# Patient Record
Sex: Female | Born: 1990 | Race: White | Hispanic: No | Marital: Married | Smoking: Never smoker
Health system: Southern US, Community
[De-identification: ages and names within clinical notes are randomized; demographics above are authoritative.]

## PROBLEM LIST (undated history)

## (undated) DIAGNOSIS — F909 Attention-deficit hyperactivity disorder, unspecified type: Secondary | ICD-10-CM

## (undated) HISTORY — DX: Attention-deficit hyperactivity disorder, unspecified type: F90.9

## (undated) HISTORY — PX: SHOULDER SURGERY: SHX246

---
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
## 2010-06-29 ENCOUNTER — Encounter: Admission: RE | Admit: 2010-06-29 | Discharge: 2010-08-05 | Payer: Self-pay | Admitting: Orthopedic Surgery

## 2011-03-22 ENCOUNTER — Other Ambulatory Visit: Payer: Self-pay | Admitting: Orthopedic Surgery

## 2011-03-22 DIAGNOSIS — M25511 Pain in right shoulder: Secondary | ICD-10-CM

## 2011-03-22 DIAGNOSIS — R531 Weakness: Secondary | ICD-10-CM

## 2011-04-11 ENCOUNTER — Ambulatory Visit
Admission: RE | Admit: 2011-04-11 | Discharge: 2011-04-11 | Disposition: A | Discharge status: Home / Self Care | Payer: 59 | Source: Ambulatory Visit | Attending: Orthopedic Surgery | Admitting: Orthopedic Surgery

## 2011-04-11 DIAGNOSIS — M25511 Pain in right shoulder: Secondary | ICD-10-CM

## 2011-04-11 DIAGNOSIS — R531 Weakness: Secondary | ICD-10-CM

## 2011-05-17 ENCOUNTER — Ambulatory Visit: Payer: PPO | Attending: Orthopedic Surgery | Admitting: Physical Therapy

## 2011-05-17 DIAGNOSIS — R269 Unspecified abnormalities of gait and mobility: Secondary | ICD-10-CM | POA: Insufficient documentation

## 2011-05-17 DIAGNOSIS — IMO0001 Reserved for inherently not codable concepts without codable children: Secondary | ICD-10-CM | POA: Insufficient documentation

## 2011-05-17 DIAGNOSIS — M25569 Pain in unspecified knee: Secondary | ICD-10-CM | POA: Insufficient documentation

## 2011-05-24 ENCOUNTER — Ambulatory Visit: Payer: PPO | Attending: Orthopedic Surgery | Admitting: Rehabilitation

## 2011-05-24 DIAGNOSIS — M25569 Pain in unspecified knee: Secondary | ICD-10-CM | POA: Insufficient documentation

## 2011-05-24 DIAGNOSIS — IMO0001 Reserved for inherently not codable concepts without codable children: Secondary | ICD-10-CM | POA: Insufficient documentation

## 2011-05-24 DIAGNOSIS — R269 Unspecified abnormalities of gait and mobility: Secondary | ICD-10-CM | POA: Insufficient documentation

## 2012-02-16 ENCOUNTER — Ambulatory Visit (INDEPENDENT_AMBULATORY_CARE_PROVIDER_SITE_OTHER): Payer: Self-pay | Admitting: Family Medicine

## 2012-02-16 ENCOUNTER — Encounter: Payer: Self-pay | Admitting: Family Medicine

## 2012-02-16 VITALS — BP 123/87 | HR 71 | Temp 98.0°F | Ht 64.0 in | Wt 150.0 lb

## 2012-02-16 DIAGNOSIS — M25511 Pain in right shoulder: Secondary | ICD-10-CM

## 2012-02-16 DIAGNOSIS — M25519 Pain in unspecified shoulder: Secondary | ICD-10-CM

## 2012-02-16 NOTE — Patient Instructions (Addendum)
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 I doubt you reinjured the labrum in your right shoulder - when the [facility] coverage goes through, call me and we can reorder an MR arthrogram to assess if you would like to go ahead with this. 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 Ice your shoulder 15 minutes at a time 3-4 times a day as needed (before and after games/practices). When possible, try to limit overhead and reaching activities. Can try aspercreme topically, aleve 2 tabs twice a day with food for inflammation. Nitro patches are a consideration - use 1/4th of a patch over affected area and change daily. If you get a headache from the patches and it's mild, ok to take tylenol, aleve for pain.  If it's too severe, stop the patch. A cortisone shot is a consideration but I wouldn't recommend doing this more than once and you'd have to be out for a week following the injection. Follow up with me in 6 weeks if I don't hear from you before then.

## 2012-02-19 ENCOUNTER — Encounter: Payer: Self-pay | Admitting: Family Medicine

## 2012-02-19 DIAGNOSIS — M25511 Pain in right shoulder: Secondary | ICD-10-CM | POA: Insufficient documentation

## 2012-02-19 NOTE — Assessment & Plan Note (Signed)
Her exam and ultrasound are consistent with impingement syndrome and biceps tendinopathy.  She has not had an injury I would suspect to cause a new labral tear (these are not well seen on ultrasound) but she is compliant with a home exercise program that would address her two diagnoses.  She does not have insurance - applying through the [facility] program.  Advised if this goes through, would consider repeating her MR arthrogram.  As far as treatment options we discussed formal physical therapy, nitro patches as adjunctive treatment (good success for rotator cuff syndrome not responding to usual therapies), subacromial injection (would need to be out of softball for at least a week following this).  She will consider trying nitro patches (sent in to pharmacy) - declined PT and injection for now.  Once [facility] coverage goes through advised her to call me if she would like to proceed with MR arthrogram.  See instructions for further.

## 2012-02-19 NOTE — Progress Notes (Signed)
Subjective:    Patient ID: Carrie Hamilton, female    DOB: 13-May-1991, 21 y.o.   MRN: [id number]  PCP: Laurann Montana  HPI 21 yo F here for right shoulder pain.  Patient reports no prior acute injury to shoulder - had insidious onset of pain in high school within right shoulder with throwing. With rest this would resolve but would worsen when she returned to sports. Had eval by Dr. August Saucer, eventually had MR arthrogram and surgery to repair torn labrum in right shoulder. She never completely improved from this procedure as she thought she would if this was the cause of her pain. No prior history of dislocation, trauma. Doing rehab with trainers regularly and is compliant with this. She is right handed. Pain now is anterior, deep but also goes into mid-bicep. States she did mildly reinjure this about 2-3 weeks ago - was lifting and going across body when felt a pop in shoulder (some ant and posterior).  No bruising, swelling following this. + night pain. Taking nsaids, alternating ice and heat. Last MR arthrogram was in 03/2011 showed an intact labrum, tiny cystic degenerative change in humerus that could predispose to impingement of infraspinatus, teres minor.  History reviewed. No pertinent past medical history.  No current outpatient prescriptions on file prior to visit.    Past Surgical History  Procedure Date  . Shoulder surgery     labral repair of right shoulder    Allergies  Allergen Reactions  . Sulfa Antibiotics     History   Social History  . Marital Status: Married    Spouse Name: N/A    Number of Children: N/A  . Years of Education: N/A   Occupational History  . Not on file.   Social History Main Topics  . Smoking status: Never Smoker   . Smokeless tobacco: Not on file  . Alcohol Use: Not on file  . Drug Use: Not on file  . Sexually Active: Not on file   Other Topics Concern  . Not on file   Social History Narrative  . No narrative on file     Family History  Problem Relation Age of Onset  . Sudden death Neg Hx   . Hyperlipidemia Neg Hx   . Heart attack Neg Hx   . Diabetes Neg Hx   . Hypertension Neg Hx     BP 123/87  Pulse 71  Temp(Src) 98 F (36.7 C) (Oral)  Ht 5\' 4"  (1.626 m)  Wt 150 lb (68.04 kg)  BMI 25.75 kg/m2  Review of Systems See HPI above.    Objective:   Physical Exam Gen: NAD  R shoulder: No swelling, ecchymoses.  No gross deformity. TTP biceps tendon, into proximal biceps muscle.  Minimal AC TTP. FROM with painful arc. Positive Hawkins, Neers. Positive yergasons, negative speeds. Strength 5/5 with empty can and resisted internal/external rotation.  Pain with empty can. Negative apprehension. Mildly positive o'briens. NV intact distally.  L shoulder: FROM without pain, weakness.  MSK u/s R shoulder: biceps tendon is intact on trans and long views - no neovacularity.  AC appears normal.  Infraspinatus, subscapularis, supraspinatus tendons intact on trans and long views.  No impingement of subscap on coracoid.  Demonstrated impingement of supraspinatus under acromion.    Assessment & Plan:  1. Right shoulder pain - Her exam and ultrasound are consistent with impingement syndrome and biceps tendinopathy.  She has not had an injury I would suspect to cause a new labral tear (these  are not well seen on ultrasound) but she is compliant with a home exercise program that would address her two diagnoses.  She does not have insurance - applying through the [facility] program.  Advised if this goes through, would consider repeating her MR arthrogram.  As far as treatment options we discussed formal physical therapy, nitro patches as adjunctive treatment (good success for rotator cuff syndrome not responding to usual therapies), subacromial injection (would need to be out of softball for at least a week following this).  She will consider trying nitro patches (sent in to pharmacy) - declined PT and injection for  now.  Once [facility] coverage goes through advised her to call me if she would like to proceed with MR arthrogram.  See instructions for further.

## 2012-02-21 ENCOUNTER — Ambulatory Visit: Payer: PPO | Admitting: Family Medicine

## 2012-02-23 ENCOUNTER — Encounter: Payer: Self-pay | Admitting: Family Medicine

## 2012-04-24 ENCOUNTER — Ambulatory Visit: Payer: PPO | Admitting: Family Medicine

## 2012-04-24 ENCOUNTER — Encounter: Payer: Self-pay | Admitting: Family Medicine

## 2012-04-24 ENCOUNTER — Ambulatory Visit (INDEPENDENT_AMBULATORY_CARE_PROVIDER_SITE_OTHER): Payer: 59 | Admitting: Family Medicine

## 2012-04-24 ENCOUNTER — Ambulatory Visit (INDEPENDENT_AMBULATORY_CARE_PROVIDER_SITE_OTHER)
Admission: RE | Admit: 2012-04-24 | Discharge: 2012-04-24 | Disposition: A | Discharge status: Home / Self Care | Payer: 59 | Source: Ambulatory Visit | Attending: Family Medicine | Admitting: Family Medicine

## 2012-04-24 VITALS — BP 110/80 | HR 60 | Temp 98.0°F | Ht 64.0 in | Wt 148.2 lb

## 2012-04-24 DIAGNOSIS — M25539 Pain in unspecified wrist: Secondary | ICD-10-CM

## 2012-04-24 DIAGNOSIS — M25532 Pain in left wrist: Secondary | ICD-10-CM

## 2012-04-24 NOTE — Patient Instructions (Signed)
Xray today  Use ice on wrist and hand 10 minutes at a time whenever you can  Will update you tomorrow

## 2012-04-24 NOTE — Assessment & Plan Note (Signed)
Wrist and hand pain after injury - over extension sliding into base Initial severe pain and swelling has improved  Out of her brace Radial wrist pain on exam worse with flexion nsaid was not helping previously Xray today and update
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
## 2012-10-01 ENCOUNTER — Other Ambulatory Visit (HOSPITAL_COMMUNITY)
Admission: RE | Admit: 2012-10-01 | Discharge: 2012-10-01 | Disposition: A | Discharge status: Home / Self Care | Payer: 59 | Source: Ambulatory Visit | Attending: Family Medicine | Admitting: Family Medicine

## 2012-10-01 ENCOUNTER — Encounter: Payer: Self-pay | Admitting: Family Medicine

## 2012-10-01 ENCOUNTER — Ambulatory Visit (INDEPENDENT_AMBULATORY_CARE_PROVIDER_SITE_OTHER): Payer: 59 | Admitting: Family Medicine

## 2012-10-01 VITALS — BP 114/78 | HR 80 | Temp 98.3°F | Ht 64.0 in | Wt 150.2 lb

## 2012-10-01 DIAGNOSIS — L739 Follicular disorder, unspecified: Secondary | ICD-10-CM | POA: Insufficient documentation

## 2012-10-01 DIAGNOSIS — Z01419 Encounter for gynecological examination (general) (routine) without abnormal findings: Secondary | ICD-10-CM | POA: Insufficient documentation

## 2012-10-01 DIAGNOSIS — L738 Other specified follicular disorders: Secondary | ICD-10-CM

## 2012-10-01 MED ORDER — NORGESTIM-ETH ESTRAD TRIPHASIC 0.18/0.215/0.25 MG-35 MCG PO TABS: 1.0000 | ORAL_TABLET | Freq: Every day | ORAL | Status: DC

## 2012-10-01 MED ORDER — CLINDAMYCIN HCL 300 MG PO CAPS: 300.0000 mg | ORAL_CAPSULE | Freq: Three times a day (TID) | ORAL | Status: DC

## 2012-10-01 NOTE — Progress Notes (Signed)
Subjective:    Patient ID: Carrie Hamilton, female    DOB: 1991-05-17, 21 y.o.   MRN: [id number]  HPI Here for OC refil and also for a bumps on her hip  Has had a staph infection (not MRSA) about a year ago -not entirely sure Was under arms  Now has red bumps - some of them look like pustules  Started about a month ago  Started echinasea- did not help  No topical meds except tea tree oil  It does not itch , is painful   On tri sprintek  Periods are ok -- they last 4-5 days - not too heavy or too painful Is sexually active  Is using condoms   Has not had a pap smear before  Is not on period  She has had the hpv vaccine  Is not worried about std - and does not want to be tested  Is monogamous and has been checked before   Patient Active Problem List  Diagnosis  . Right shoulder pain  . Left wrist pain   No past medical history on file. Past Surgical History  Procedure Date  . Shoulder surgery     labral repair of right shoulder   History  Substance Use Topics  . Smoking status: Never Smoker   . Smokeless tobacco: Not on file  . Alcohol Use: Yes     occasional   Family History  Problem Relation Age of Onset  . Sudden death Neg Hx   . Hyperlipidemia Neg Hx   . Heart attack Neg Hx   . Diabetes Neg Hx   . Hypertension Neg Hx    Allergies  Allergen Reactions  . Sulfa Antibiotics    Current Outpatient Prescriptions on File Prior to Visit  Medication Sig Dispense Refill  . Norgestimate-Ethinyl Estradiol Triphasic (TRI-SPRINTEC) 0.18/0.215/0.25 MG-35 MCG tablet Take 1 tablet by mouth daily.             Review of Systems Review of Systems  Constitutional: Negative for fever, appetite change, fatigue and unexpected weight change.  Eyes: Negative for pain and visual disturbance.  Respiratory: Negative for cough and shortness of breath.   Cardiovascular: Negative for cp or palpitations    Gastrointestinal: Negative for nausea, diarrhea and constipation.    Genitourinary: Negative for urgency and frequency.  Skin: Negative for pallor and pos for rash Neurological: Negative for weakness, light-headedness, numbness and headaches.  Hematological: Negative for adenopathy. Does not bruise/bleed easily.  Psychiatric/Behavioral: Negative for dysphoric mood. The patient is not nervous/anxious.         Objective:   Physical Exam  Constitutional: She appears well-developed and well-nourished. No distress.  HENT:  Head: Normocephalic and atraumatic.  Mouth/Throat: Oropharynx is clear and moist. No oropharyngeal exudate.  Eyes: Conjunctivae normal and EOM are normal. Pupils are equal, round, and reactive to light. Right eye exhibits no discharge. Left eye exhibits no discharge.  Neck: Normal range of motion. Neck supple. No JVD present. Carotid bruit is not present. No thyromegaly present.  Cardiovascular: Normal rate, regular rhythm and normal heart sounds.   Pulmonary/Chest: Effort normal and breath sounds normal. No respiratory distress. She has no wheezes.  Abdominal: Soft. Bowel sounds are normal. She exhibits no distension, no abdominal bruit and no mass. There is no tenderness.  Genitourinary: Vagina normal. No breast swelling, tenderness, discharge or bleeding. There is no rash, tenderness or lesion on the right labia. There is no rash, tenderness or lesion on the left labia. Uterus is  not enlarged and not tender. Cervix exhibits no motion tenderness, no discharge and no friability. Right adnexum displays no mass, no tenderness and no fullness. Left adnexum displays no mass, no tenderness and no fullness.       Breast exam: No mass, nodules, thickening, tenderness, bulging, retraction, inflamation, nipple discharge or skin changes noted.  No axillary or clavicular LA.  Chaperoned exam.    Musculoskeletal: She exhibits no edema.  Lymphadenopathy:    She has no cervical adenopathy.  Neurological: She is alert. She has normal reflexes. Coordination  normal.  Skin: Skin is warm and dry. Rash noted.       Folliculitis with some papules and pustules on L outer thigh No vesicles    Psychiatric: She has a normal mood and affect.          Assessment & Plan:

## 2012-10-01 NOTE — Patient Instructions (Addendum)
Clean rash area with antibacterial soap and water Don't shave until better  Take clindamycin as directed Update if not starting to improve in a week or if worsening   We did pap smear today

## 2012-10-01 NOTE — Assessment & Plan Note (Signed)
Did first pap today  Pt tolerated well Refilled OC No problems Disc std prev

## 2012-10-01 NOTE — Assessment & Plan Note (Signed)
On L upper outer thigh ? Hx of mrsa in past This is mild See avs for inst tx with clindamycin and update

## 2012-10-03 ENCOUNTER — Encounter: Payer: Self-pay | Admitting: *Deleted

## 2012-10-07 ENCOUNTER — Ambulatory Visit: Payer: 59 | Admitting: Family Medicine

## 2012-10-14 ENCOUNTER — Telehealth: Payer: Self-pay

## 2012-10-14 MED ORDER — CLINDAMYCIN HCL 300 MG PO CAPS: 300.0000 mg | ORAL_CAPSULE | Freq: Three times a day (TID) | ORAL | Status: DC

## 2012-10-14 NOTE — Telephone Encounter (Signed)
Pt notified Rx was sent in and if bump comes back needs to follow up with doctor either at college or here

## 2012-10-14 NOTE — Telephone Encounter (Signed)
Pt left v/m requesting antibiotic; bump on hip has come back; unable to reach pt but spoke with pts mother and pt is at college request med sent to [facility];  bump completely went away but pt noticed bump again on 10/13/12.Please advise.

## 2012-10-14 NOTE — Telephone Encounter (Signed)
Will tx with cleocin again but if it does not improve she will need to see a doctor at her college  Sent electronically

## 2012-10-24 ENCOUNTER — Telehealth: Payer: Self-pay | Admitting: Family Medicine

## 2012-10-24 MED ORDER — FLUCONAZOLE 150 MG PO TABS: ORAL_TABLET | ORAL | Status: DC

## 2012-10-24 NOTE — Telephone Encounter (Signed)
LMOM that rx was called in and to call if any questions or problems

## 2012-10-24 NOTE — Telephone Encounter (Signed)
Okay to send Rx for fluconazole 150mg  #2 x 0 Take 1 now for yeast infection. Repeat in 1 week prn

## 2012-10-24 NOTE — Telephone Encounter (Signed)
Caller: Wanda/Mother; Patient Name: Carrie Hamilton; PCP: [facility] Best Callback Phone Number: [phone] Pt has been taking antbiotics for a staph infection dx by Dr. Milinda Antis. Pt has had to take 2 rounds. Pt has developed a vaginal yeast infection and wants to have a yeast tx called in to [facility]. Mom did not ask any questions/but its the same sx as last time. Pt unavailable for triage./in classes all day. Office please call in yeast medication to [facility].

## 2013-02-19 ENCOUNTER — Other Ambulatory Visit: Payer: Self-pay | Admitting: Family Medicine

## 2013-02-19 MED ORDER — NORGESTIM-ETH ESTRAD TRIPHASIC 0.18/0.215/0.25 MG-35 MCG PO TABS: 1.0000 | ORAL_TABLET | Freq: Every day | ORAL | Status: DC

## 2013-02-19 NOTE — Telephone Encounter (Signed)
Received request for 3 month supply instead of 1 month supply, Rx changed

## 2013-06-08 IMAGING — CR DG WRIST COMPLETE 3+V*L*
2 series · 2 of 2 positions shown · non-contrast
Comparison: None.

CLINICAL DATA: Left wrist pain post injury

LEFT WRIST - COMPLETE 3+ VIEW

[view not recorded (1 of 2)]
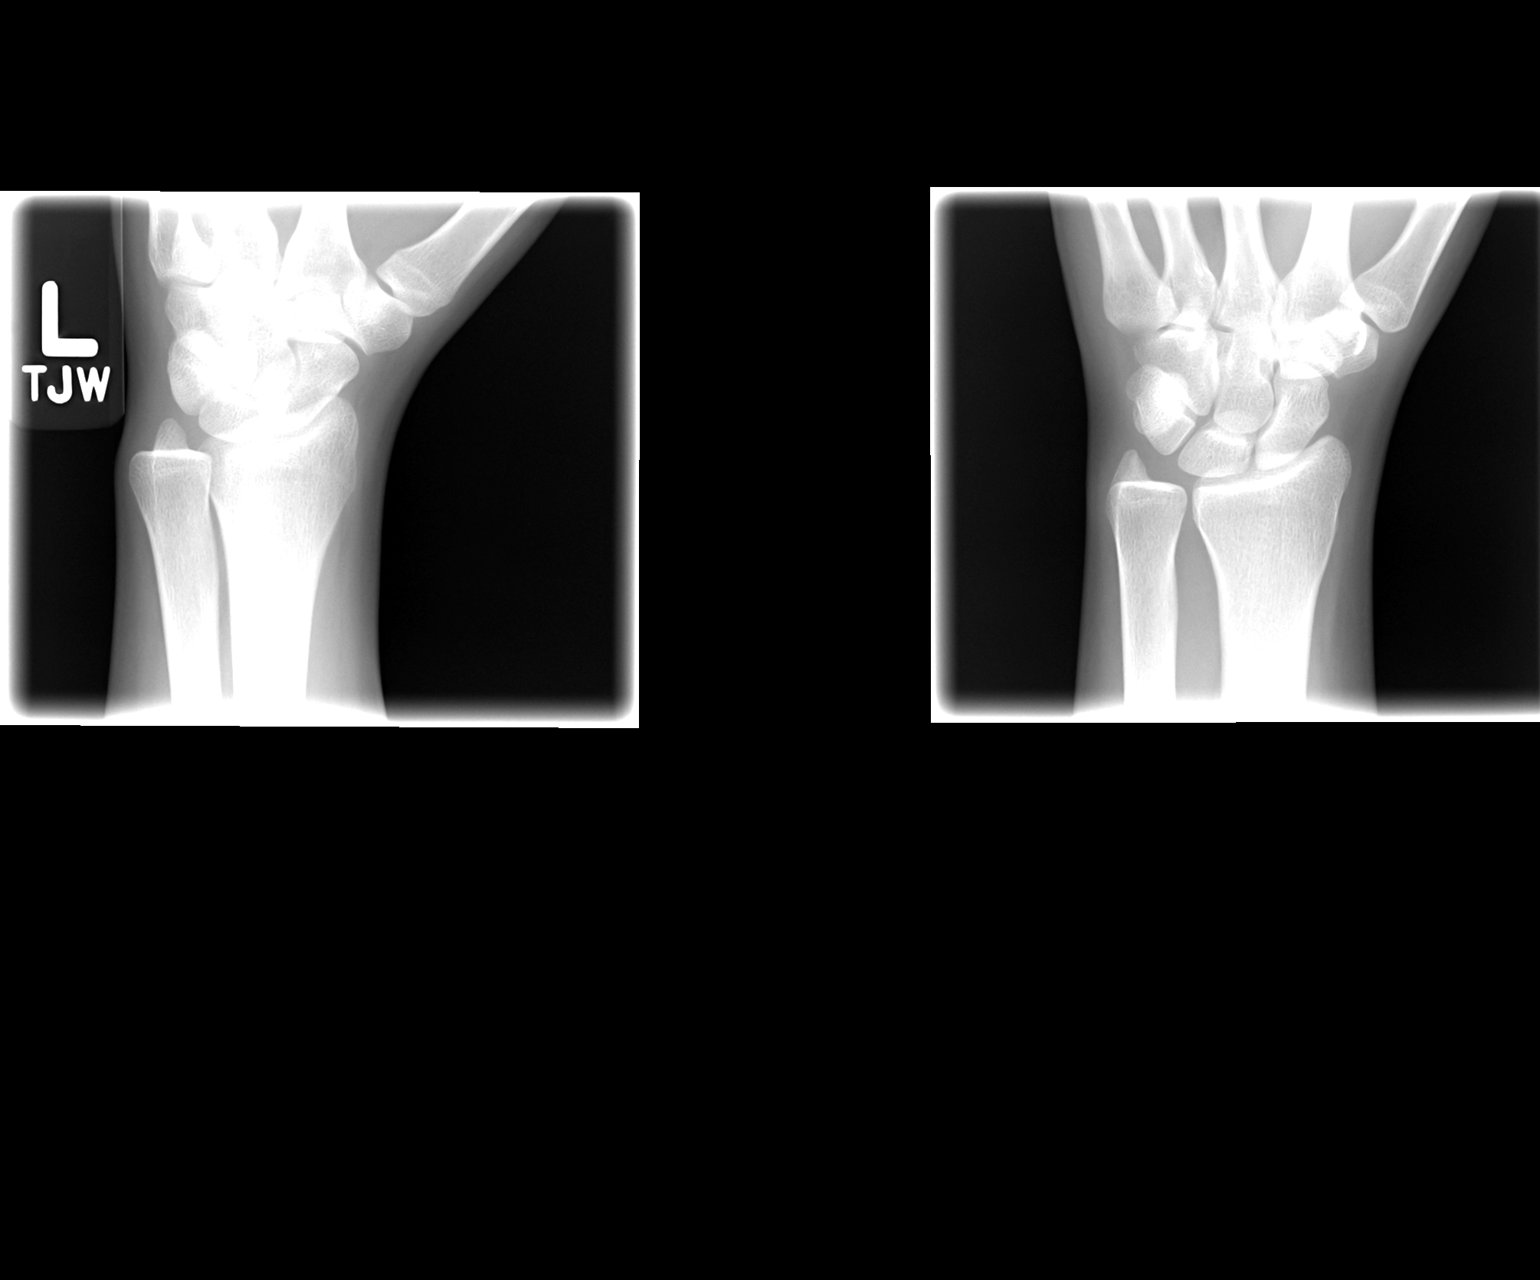

[view not recorded (2 of 2)]
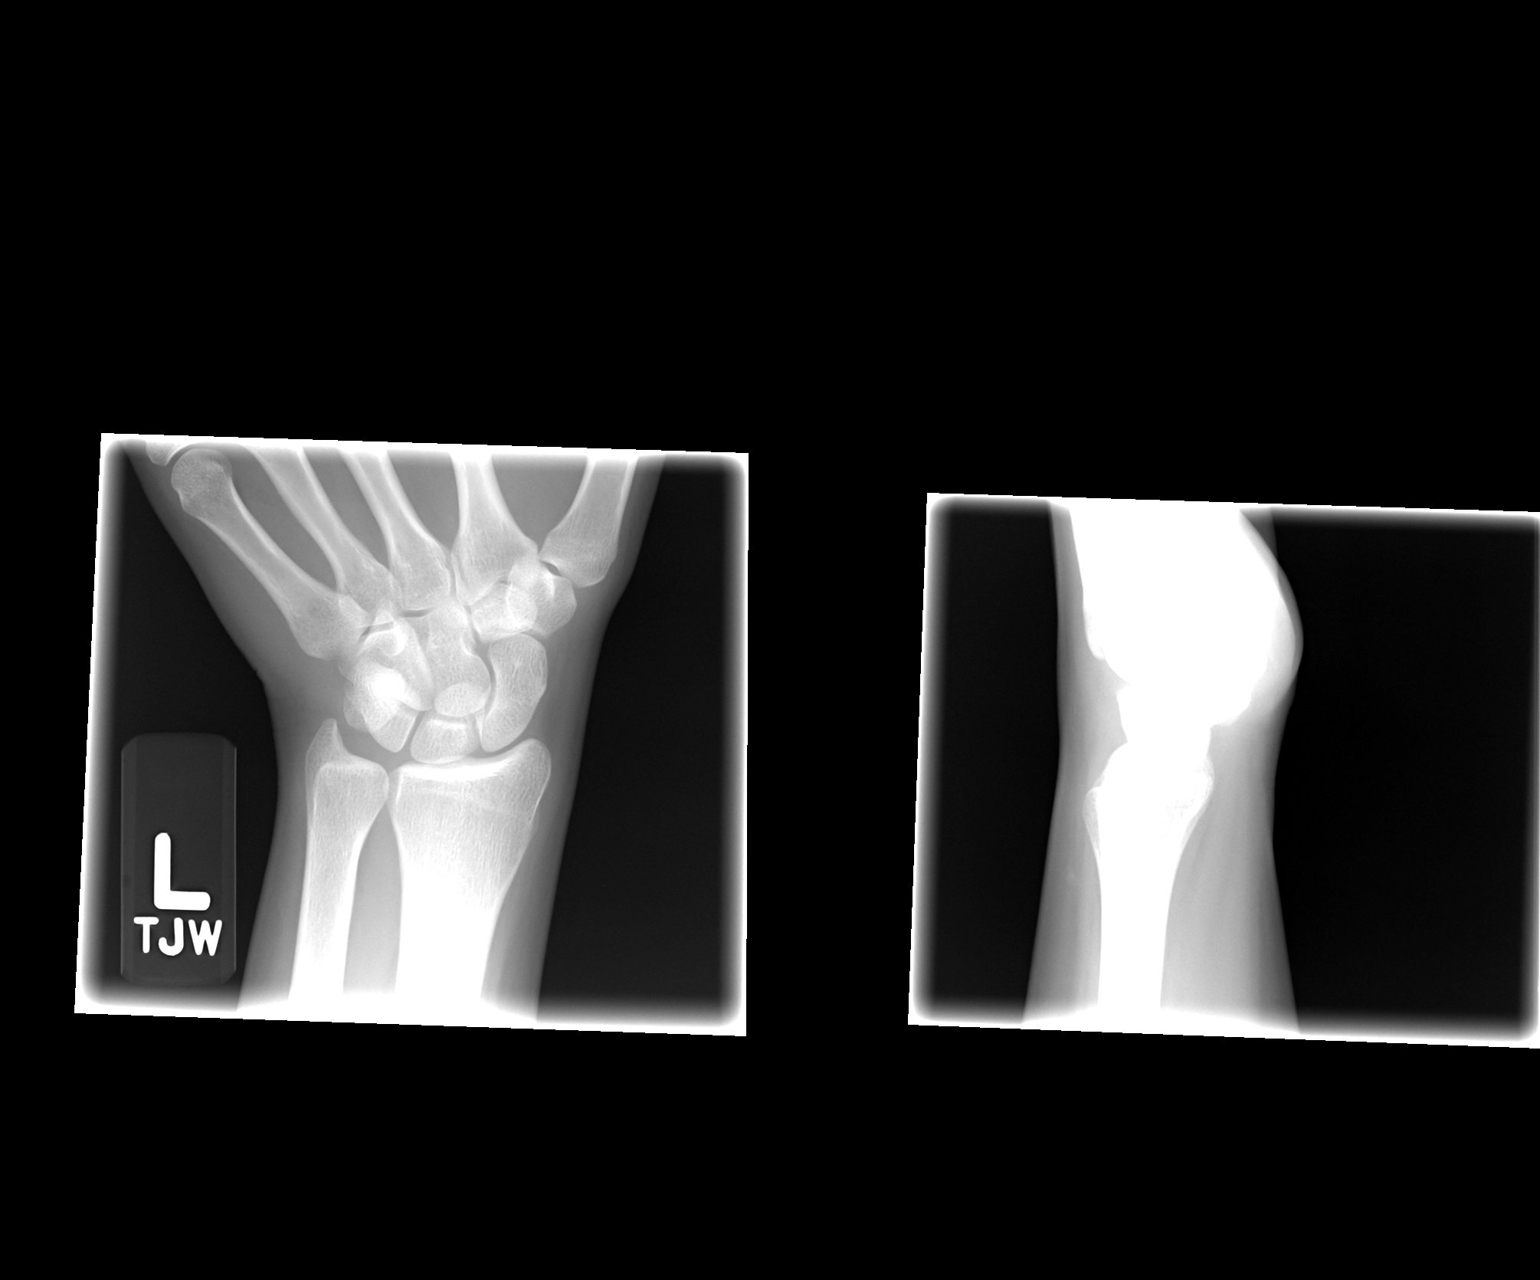

[2 of 2 positions shown; findings below may reference images not displayed]

FINDINGS: Four views of the left wrist submitted.  No acute
fracture or subluxation.  No radiopaque foreign body.
IMPRESSION: No acute fracture or subluxation.

## 2013-08-26 ENCOUNTER — Telehealth: Payer: Self-pay

## 2013-08-26 NOTE — Telephone Encounter (Signed)
Stop it and stay off of it  Follow up with me when able to see how bp is off of med and discuss options  If sob returns or cp-go to ER If she is in school and unable to come in here - I would go to the health clinic at her school  Please remove from her med list

## 2013-08-26 NOTE — Telephone Encounter (Signed)
Pt taking tri trevifen thinks causing BP to be elevated; BP averages 140/90 for the last month. No h/a,dizziness or SOB. On 08/25/13 felt like heart racing and hard to get breath. Today pt did not take tri trevifen and pt feels OK. [facility] [address]Please advise.

## 2013-08-26 NOTE — Telephone Encounter (Signed)
Pt advise to stop med, med removed from med list. Pt advise if sob or cp go to ER. Pt is able to schedule a f/u with you but wasn't sure what time would work for her so she will call back to schedule a f/u appt

## 2019-04-11 DIAGNOSIS — R030 Elevated blood-pressure reading, without diagnosis of hypertension: Secondary | ICD-10-CM | POA: Insufficient documentation

## 2020-11-09 ENCOUNTER — Encounter: Payer: Self-pay | Admitting: *Deleted

## 2020-11-16 ENCOUNTER — Ambulatory Visit (INDEPENDENT_AMBULATORY_CARE_PROVIDER_SITE_OTHER): Payer: Commercial Managed Care - PPO

## 2020-11-16 ENCOUNTER — Other Ambulatory Visit: Payer: Self-pay

## 2020-11-16 VITALS — BP 137/96 | HR 108 | Wt 191.0 lb

## 2020-11-16 DIAGNOSIS — Z6791 Unspecified blood type, Rh negative: Secondary | ICD-10-CM | POA: Insufficient documentation

## 2020-11-16 DIAGNOSIS — O26899 Other specified pregnancy related conditions, unspecified trimester: Secondary | ICD-10-CM | POA: Insufficient documentation

## 2020-11-16 DIAGNOSIS — Z3A16 16 weeks gestation of pregnancy: Secondary | ICD-10-CM

## 2020-11-16 DIAGNOSIS — R030 Elevated blood-pressure reading, without diagnosis of hypertension: Secondary | ICD-10-CM

## 2020-11-16 DIAGNOSIS — Z3480 Encounter for supervision of other normal pregnancy, unspecified trimester: Secondary | ICD-10-CM | POA: Insufficient documentation

## 2020-11-16 NOTE — Progress Notes (Signed)
Subjective:   Carrie Hamilton is a 29 y.o. G2P0010 at [redacted]w[redacted]d by [employer] being seen today for her first obstetrical visit after transferring from [facility].  Patient reports change due to insurance and expresses desire for WB.  Gynecological/Obstetrical History: Her obstetrical history is unremarkable. Patient does intend to breast feed. Pregnancy history fully reviewed. Patient reports no complaints. Sexual Activity and Vaginal Concerns: Patient denies issues with vaginal discharge or bleeding.   Medical History/ROS:Denies significant medical history.  Reports feeling "winded easier" with some activities such as walking and home DIY projects.  Patient denies issues with urination, constipation, and diarrhea.    Social History: Patient denies history or current usage of tobacco, alcohol, or drugs.  Patient reports the FOB is Joselyn Glassman who is present and supportive.  Patient reports that she lives with husband and endorses safety at home. 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  HISTORY: OB History  Gravida Para Term Preterm AB Living  2 0 0 0 1 0  SAB TAB Ectopic Multiple Live Births  1 0 0 0 0    # Outcome Date GA Lbr Len/2nd Weight Sex Delivery Anes PTL Lv  2 Current           1 SAB             Last pap smear was done Oct 2020 and was normal  Past Medical History:  Diagnosis Date  . ADHD    Past Surgical History:  Procedure Laterality Date  . SHOULDER SURGERY     labral repair of right shoulder   Family History  Problem Relation Age of Onset  . Hypertension Mother   . Heart attack Father   . Sudden death Neg Hx   . Hyperlipidemia Neg Hx   . Diabetes Neg Hx    Social History   Tobacco Use  . Smoking status: Never Smoker  Vaping Use  . Vaping Use: Never used  Substance Use Topics  . Alcohol use: Yes    Comment: occasional  . Drug use: No   Allergies  Allergen Reactions  . Sulfa Antibiotics Rash   Current Outpatient Medications on  File Prior to Visit  Medication Sig Dispense Refill  . Ascorbic Acid (VITAMIN C) 1000 MG tablet Take 1,000 mg by mouth daily.    . Magnesium 250 MG TABS Take by mouth.    . Omega-3 Fatty Acids (FISH OIL) 500 MG CAPS Take by mouth.    . Prenatal Vit-Fe Fumarate-FA (PRENATAL VITAMINS PO) Take by mouth.    . vitamin B-12 (CYANOCOBALAMIN) 100 MCG tablet Take 1 tablet by mouth daily.    Marland Kitchen VITAMIN D-VITAMIN K PO Take by mouth.    . Zinc Sulfate (ZINC 15 PO) Take by mouth.     No current facility-administered medications on file prior to visit.    Review of Systems Pertinent items noted in HPI and remainder of comprehensive ROS otherwise negative.  Exam   Vitals:   11/16/20 1022  BP: (!) 137/96  Pulse: (!) 108  Weight: 191 lb (86.6 kg)   Fetal Heart Rate (bpm): 154  Physical Exam Constitutional:      General: She is not in acute distress.    Appearance: Normal appearance. She is not toxic-appearing.  HENT:     Head: Normocephalic and atraumatic.  Eyes:     Conjunctiva/sclera: Conjunctivae normal.  Cardiovascular:     Rate and Rhythm: Normal rate and regular rhythm.  Heart sounds: Normal heart sounds.  Pulmonary:     Effort: Pulmonary effort is normal. No respiratory distress.     Breath sounds: Normal breath sounds.  Abdominal:     General: Bowel sounds are normal.     Comments: Fundus at U/-2  Musculoskeletal:        General: Normal range of motion.     Cervical back: Normal range of motion.     Right lower leg: No edema.     Left lower leg: No edema.  Neurological:     Mental Status: She is alert and oriented to person, place, and time.  Skin:    General: Skin is warm and dry.  Psychiatric:        Mood and Affect: Mood normal.        Behavior: Behavior normal.        Thought Content: Thought content normal.     Assessment:   29 y.o. year old G2P0010 Patient Active Problem List   Diagnosis Date Noted  . Supervision of other normal pregnancy, antepartum  11/16/2020  . Rh negative status during pregnancy 11/16/2020  . Routine gynecological examination 10/01/2012  . Folliculitis 10/01/2012  . Left wrist pain 04/24/2012  . Right shoulder pain 02/19/2012     Plan:  1. Supervision of other normal pregnancy, antepartum -Congratulations given and patient welcomed to practice. -Discussed usage of Babyscripts and virtual visits as additional source of managing and completing PN visits in midst of coronavirus.   *Instructed to take blood pressure and record weekly into babyscripts. *Reviewed modified prenatal visit schedule and platforms used for virtual visits.  -Anticipatory guidance for prenatal visits including labs, ultrasounds, and testing. -Genetic Screening discussed, First trimester screen, Quad screen and NIPS: declined. -Encouraged to complete MyChart Registration for her ability to review results, send requests, and have questions addressed.  -Discussed estimated due date of May 03, 2021. -Ultrasound discussed; fetal anatomic survey: ordered. -Initiate prenatal vitamins; Rx sent to pharmacy on file.  -Influenza offered and declined -Covid Vaccine offered and declined. -Encouraged to seek out care at office or emergency room for urgent and/or emergent concerns. -Educated on the nature of [facility] with multiple MDs and other Advanced Practice Providers was explained to patient; also emphasized that residents, students are part of our team. Informed of her right to refuse care as she deems appropriate.  -No questions or concerns.    2. Rh negative, antepartum -Informed of RH status. -Educated on what this means for patient, baby, and future pregnancies. -Reviewed need for rhogam at 30 weeks or before with significant bleeding incidents.   3. [redacted] weeks gestation of pregnancy -Anticipatory guidance for upcoming appts. -Reviewed desire and candidacy for WB. -Discussed and shown [email or web address]  site.  Encouraged to sign up for birthing classes as well as complete virtual tour of hospital. -Discussed activities during pregnancy.  Reassured that some feelings of "winded" normal.  Encouraged rest, hydration, and increase in protein rich snacks. -Encouraged to contact insurance for resources available (I.e. breast pumps, nursing lines, child birthing books).  4. Elevated BP without diagnosis of hypertension -Informed of potential for CHTN diagnosis of bp remain elevated.  -Encouraged to record BP into mychart   Problem list reviewed and updated. Routine obstetric precautions reviewed.  No orders of the defined types were placed in this encounter.   No follow-ups on file.     Cherre Robins, CNM 11/16/2020 11:03 AM

## 2020-11-16 NOTE — Patient Instructions (Signed)

## 2020-11-16 NOTE — Progress Notes (Signed)
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

## 2020-12-07 ENCOUNTER — Ambulatory Visit: Payer: Commercial Managed Care - PPO

## 2020-12-07 ENCOUNTER — Other Ambulatory Visit: Payer: Self-pay | Admitting: *Deleted

## 2020-12-07 ENCOUNTER — Other Ambulatory Visit: Payer: Self-pay

## 2020-12-07 DIAGNOSIS — Z3A16 16 weeks gestation of pregnancy: Secondary | ICD-10-CM

## 2020-12-07 DIAGNOSIS — Z362 Encounter for other antenatal screening follow-up: Secondary | ICD-10-CM

## 2020-12-07 DIAGNOSIS — Z3480 Encounter for supervision of other normal pregnancy, unspecified trimester: Secondary | ICD-10-CM | POA: Diagnosis present

## 2020-12-15 ENCOUNTER — Ambulatory Visit (INDEPENDENT_AMBULATORY_CARE_PROVIDER_SITE_OTHER): Payer: Commercial Managed Care - PPO | Admitting: Advanced Practice Midwife

## 2020-12-15 ENCOUNTER — Other Ambulatory Visit: Payer: Self-pay

## 2020-12-15 VITALS — BP 135/80 | HR 102 | Wt 196.0 lb

## 2020-12-15 DIAGNOSIS — O26892 Other specified pregnancy related conditions, second trimester: Secondary | ICD-10-CM

## 2020-12-15 DIAGNOSIS — Z3A20 20 weeks gestation of pregnancy: Secondary | ICD-10-CM

## 2020-12-15 DIAGNOSIS — Z3480 Encounter for supervision of other normal pregnancy, unspecified trimester: Secondary | ICD-10-CM

## 2020-12-15 DIAGNOSIS — R102 Pelvic and perineal pain: Secondary | ICD-10-CM

## 2020-12-15 MED ORDER — COMFORT FIT MATERNITY SUPP MED MISC: 1.0000 | Freq: Every day | 0 refills | Status: DC

## 2020-12-15 NOTE — Patient Instructions (Signed)
Considering Waterbirth? Guide for patients at [facility] ([facility]) Why consider waterbirth? . Gentle birth for babies  . Less pain medicine used in labor  . May allow for passive descent/less pushing  . May reduce perineal tears  . More mobility and instinctive maternal position changes  . Increased maternal relaxation   Is waterbirth safe? What are the risks of infection, drowning or other complications? . Infection:  . Very low risk (3.7 % for tub vs 4.8% for bed)  . 7 in 8000 waterbirths with documented infection  . Poorly cleaned equipment most common cause  . Slightly lower group B strep transmission rate  . Drowning  . Maternal:  . Very low risk  . Related to seizures or fainting  . Newborn:  . Very low risk. No evidence of increased risk of respiratory problems in multiple large studies  . Physiological protection from breathing under water  . Avoid underwater birth if there are any fetal complications  . Once baby's head is out of the water, keep it out.  . Birth complication  . Some reports of cord trauma, but risk decreased by bringing baby to surface gradually  . No evidence of increased risk of shoulder dystocia. Mothers can usually change positions faster in water than in a bed, possibly aiding the maneuvers to free the shoulder.   There are 2 things you MUST do to have a waterbirth with [facility]: 1. Attend a waterbirth class at [facility]   a. 3rd Wednesday of every month from 7-9 pm (virtual during COVID) b. Free c. Register by calling [phone] or register online at [email or web address] d. Bring us the certificate from the class to your prenatal appointment or send via MyChart 2. Meet with a midwife at 36 weeks* to see if you can still plan a waterbirth and to sign the consent.   *We also recommend that you schedule as many of your prenatal visits with a midwife as possible.    Helpful information: . You may  want to bring a bathing suit top to the hospital to wear during labor but this is optional.  All other supplies are provided by the hospital. . Please arrive at the hospital with signs of active labor, and do not wait at home until late in labor. It takes 45 min- 2 hours for COVID testing, fetal monitoring, and check in to your room to take place, plus transport and filling of the waterbirth tub.    Things that would prevent you from having a waterbirth: . Unknown or Positive COVID-19 diagnosis upon admission to hospital* . Premature, <37wks  . Previous cesarean birth  . Presence of thick meconium-stained fluid  . Multiple gestation (Twins, triplets, etc.)  . Uncontrolled diabetes or gestational diabetes requiring medication  . Hypertension diagnosed in pregnancy or preexisting hypertension (gestational hypertension, preeclampsia, or chronic hypertension) . Heavy vaginal bleeding  . Non-reassuring fetal heart rate  . Active infection (MRSA, etc.). Group B Strep is NOT a contraindication for waterbirth.  . If your labor has to be induced and induction method requires continuous monitoring of the baby's heart rate  . Other risks/issues identified by your obstetrical provider   Please remember that birth is unpredictable. Under certain unforeseeable circumstances your provider may advise against giving birth in the tub. These decisions will be made on a case-by-case basis and with the safety of you and your baby as our highest priority.   *Please remember that in order   to have a waterbirth, you must test Negative to COVID-19 upon admission to the hospital.  Updated 11/02/2020  

## 2020-12-15 NOTE — Progress Notes (Signed)
   PRENATAL VISIT NOTE  Subjective:  Carrie Hamilton is a 29 y.o. G2P0010 at [redacted]w[redacted]d being seen today for ongoing prenatal care.  She is currently monitored for the following issues for this low-risk pregnancy and has Right shoulder pain; Left wrist pain; Routine gynecological examination; Folliculitis; Supervision of other normal pregnancy, antepartum; and Rh negative status during pregnancy on their problem list.  Patient reports pelvic pain when walking.  Contractions: Not present. Vag. Bleeding: None.   . Denies leaking of fluid.   The following portions of the patient's history were reviewed and updated as appropriate: allergies, current medications, past family history, past medical history, past social history, past surgical history and problem list.   Objective:   Vitals:   12/15/20 0920  BP: 135/80  Pulse: (!) 102  Weight: 196 lb (88.9 kg)    Fetal Status: Fetal Heart Rate (bpm): 138         General:  Alert, oriented and cooperative. Patient is in no acute distress.  Skin: Skin is warm and dry. No rash noted.   Cardiovascular: Normal heart rate noted  Respiratory: Normal respiratory effort, no problems with respiration noted  Abdomen: Soft, gravid, appropriate for gestational age.        Pelvic: Cervical exam deferred        Extremities: Normal range of motion.  Edema: None  Mental Status: Normal mood and affect. Normal behavior. Normal judgment and thought content.   Assessment and Plan:  Pregnancy: G2P0010 at [redacted]w[redacted]d 1. Supervision of other normal pregnancy, antepartum --Anticipatory guidance about next visits/weeks of pregnancy given. --Next appt in 4 weeks --Pt interested in waterbirth, reviewed contraindications, pt enrolled in class --Pt with high normal BP today, initial OB slightly elevated but pt reports white coat syndrome.  BPs are better at home (see Babyscripts).   2. [redacted] weeks gestation of pregnancy   3. Pelvic pain affecting pregnancy in second trimester,  antepartum --Rest/ice/heat/warm bath/Tylenol/pregnancy support belt  - Elastic Bandages & Supports (COMFORT FIT MATERNITY SUPP MED) MISC; 1 Device by Does not apply route daily.  Dispense: 1 each; Refill: 0  Preterm labor symptoms and general obstetric precautions including but not limited to vaginal bleeding, contractions, leaking of fluid and fetal movement were reviewed in detail with the patient. Please refer to After Visit Summary for other counseling recommendations.   Return in about 4 weeks (around 01/12/2021).  Future Appointments  Date Time Provider Department Center  01/04/2021  1:00 PM [facility] NURSE [facility]  01/04/2021  1:15 PM [facility]  01/11/2021  9:10 AM Sharyon Cable, CNM [facility]    Sharen Counter, CNM

## 2020-12-18 NOTE — L&D Delivery Note (Signed)
OB/GYN Faculty Practice Delivery Note  Carrie Hamilton is a 30 y.o. G2P0010 s/p vaginal delivery at [redacted]w[redacted]d. She was admitted for IOL secondary to gHTN.   ROM: 26h 22m with clear fluid GBS Status: positive; adequate antibiotics prior to delivery Maximum Maternal Temperature: 98.37F  Labor Progress: Pt was admitted on 05/01/21, at which time she received cytotec x7 doses and FB was placed. Pt was transitioned to pitocin at 1730 on 5/16 and had SROM for clear fluid at 1800 on 5/16. She then slowly progressed to complete cervical dilation with up-titration of pitocin. Pt found to have complete cervical dilation at 1911 on 5/17. She then pushed and had an uncomplicated delivery as noted below.  Delivery Date/Time: 05/03/21 at 2013 Delivery: Called to room and patient was complete and pushing. Head delivered LOA. No nuchal cord present. Shoulder and body delivered in usual fashion. Infant with spontaneous cry, placed on mother's abdomen, dried and stimulated. Cord clamped x 2 after 1-minute delay, and cut by FOB under my direct supervision. Cord blood drawn. Placenta delivered spontaneously with gentle cord traction. Fundus firm with massage and Pitocin. Labia, perineum, vagina, and cervix were inspected, notable for second degree perineal and left labial lacerations s/p repair in standard fashion with use of 3-0 vicryl.   Placenta: 3-vessel cord, intact, sent to L&D Complications: none Lacerations: second degree perineal and left labial lacerations s/p repair EBL: 200 ml Analgesia: epidural, subcutaneous lidocaine  Infant: viable female  APGARs 8 & 9  weight per medical record  Lynnda Shields, MD OB/GYN Fellow, Faculty Practice

## 2021-01-04 ENCOUNTER — Ambulatory Visit: Payer: Commercial Managed Care - PPO | Attending: Obstetrics and Gynecology

## 2021-01-04 ENCOUNTER — Ambulatory Visit: Payer: Commercial Managed Care - PPO | Admitting: *Deleted

## 2021-01-04 ENCOUNTER — Encounter: Payer: Self-pay | Admitting: *Deleted

## 2021-01-04 ENCOUNTER — Other Ambulatory Visit: Payer: Self-pay

## 2021-01-04 DIAGNOSIS — Z3480 Encounter for supervision of other normal pregnancy, unspecified trimester: Secondary | ICD-10-CM

## 2021-01-04 DIAGNOSIS — Z3A23 23 weeks gestation of pregnancy: Secondary | ICD-10-CM | POA: Diagnosis not present

## 2021-01-04 DIAGNOSIS — Z362 Encounter for other antenatal screening follow-up: Secondary | ICD-10-CM | POA: Diagnosis not present

## 2021-01-11 ENCOUNTER — Encounter: Payer: Self-pay | Admitting: Certified Nurse Midwife

## 2021-01-11 ENCOUNTER — Telehealth (INDEPENDENT_AMBULATORY_CARE_PROVIDER_SITE_OTHER): Payer: Commercial Managed Care - PPO | Admitting: Certified Nurse Midwife

## 2021-01-11 VITALS — BP 119/76 | HR 100 | Wt 199.0 lb

## 2021-01-11 DIAGNOSIS — O99891 Other specified diseases and conditions complicating pregnancy: Secondary | ICD-10-CM

## 2021-01-11 DIAGNOSIS — Z3480 Encounter for supervision of other normal pregnancy, unspecified trimester: Secondary | ICD-10-CM

## 2021-01-11 DIAGNOSIS — R0981 Nasal congestion: Secondary | ICD-10-CM

## 2021-01-11 DIAGNOSIS — Z3A24 24 weeks gestation of pregnancy: Secondary | ICD-10-CM

## 2021-01-11 NOTE — Patient Instructions (Addendum)
Oral Glucose Tolerance Test During Pregnancy Why am I having this test? The oral glucose tolerance test (OGTT) is done to check how your body processes blood sugar (glucose). This is one of several tests used to diagnose diabetes that develops during pregnancy (gestational diabetes mellitus). Gestational diabetes is a short-term form of diabetes that some women develop while they are pregnant. It usually occurs during the second trimester of pregnancy and goes away after delivery. Testing, or screening, for gestational diabetes usually occurs at weeks 24-28 of pregnancy. You may have the OGTT test after having a 1-hour glucose screening test if the results from that test indicate that you may have gestational diabetes. This test may also be needed if:  You have a history of gestational diabetes.  There is a history of giving birth to very large babies or of losing pregnancies (having stillbirths).  You have signs and symptoms of diabetes, such as: ? Changes in your eyesight. ? Tingling or numbness in your hands or feet. ? Changes in hunger, thirst, and urination, and these are not explained by your pregnancy. What is being tested? This test measures the amount of glucose in your blood at different times during a period of 3 hours. This shows how well your body can process glucose. What kind of sample is taken? Blood samples are required for this test. They are usually collected by inserting a needle into a blood vessel.   How do I prepare for this test?  For 3 days before your test, eat normally. Have plenty of carbohydrate-rich foods.  Follow instructions from your health care provider about: ? Eating or drinking restrictions on the day of the test. You may be asked not to eat or drink anything other than water (to fast) starting 8-10 hours before the test. ? Changing or stopping your regular medicines. Some medicines may interfere with this test. Tell a health care provider about:  All  medicines you are taking, including vitamins, herbs, eye drops, creams, and over-the-counter medicines.  Any blood disorders you have.  Any surgeries you have had.  Any medical conditions you have. What happens during the test? First, your blood glucose will be measured. This is referred to as your fasting blood glucose because you fasted before the test. Then, you will drink a glucose solution that contains a certain amount of glucose. Your blood glucose will be measured again 1, 2, and 3 hours after you drink the solution. This test takes about 3 hours to complete. You will need to stay at the testing location during this time. During the testing period:  Do not eat or drink anything other than the glucose solution.  Do not exercise.  Do not use any products that contain nicotine or tobacco, such as cigarettes, e-cigarettes, and chewing tobacco. These can affect your test results. If you need help quitting, ask your health care provider. The testing procedure may vary among health care providers and hospitals. How are the results reported? Your results will be reported as milligrams of glucose per deciliter of blood (mg/dL) or millimoles per liter (mmol/L). There is more than one source for screening and diagnosis reference values used to diagnose gestational diabetes. Your health care provider will compare your results to normal values that were established after testing a large group of people (reference values). Reference values may vary among labs and hospitals. For this test (Carpenter-Coustan), reference values are:  Fasting: 95 mg/dL (5.3 mmol/L).  1 hour: 180 mg/dL (10.0 mmol/L).  2 hour:   155 mg/dL (8.6 mmol/L).  3 hour: 140 mg/dL (7.8 mmol/L). What do the results mean? Results below the reference values are considered normal. If two or more of your blood glucose levels are at or above the reference values, you may be diagnosed with gestational diabetes. If only one level is  high, your health care provider may suggest repeat testing or other tests to confirm a diagnosis. Talk with your health care provider about what your results mean. Questions to ask your health care provider Ask your health care provider, or the department that is doing the test:  When will my results be ready?  How will I get my results?  What are my treatment options?  What other tests do I need?  What are my next steps? Summary  The oral glucose tolerance test (OGTT) is one of several tests used to diagnose diabetes that develops during pregnancy (gestational diabetes mellitus). Gestational diabetes is a short-term form of diabetes that some women develop while they are pregnant.  You may have the OGTT test after having a 1-hour glucose screening test if the results from that test show that you may have gestational diabetes. You may also have this test if you have any symptoms or risk factors for this type of diabetes.  Talk with your health care provider about what your results mean. This information is not intended to replace advice given to you by your health care provider. Make sure you discuss any questions you have with your health care provider. Document Revised: 05/13/2020 Document Reviewed: 05/13/2020 Elsevier Patient Education  2021 [employer]  

## 2021-01-11 NOTE — Progress Notes (Signed)
OBSTETRICS PRENATAL VIRTUAL VISIT ENCOUNTER NOTE  [facility] [place]   I connected with Carrie RousselHannah Hamilton on 01/11/21 at  9:10 AM EST by MyChart Video Encounter at home and verified that I am speaking with the correct person using two identifiers.   I discussed the limitations, risks, security and privacy concerns of performing an evaluation and management service virtually and the availability of in person appointments. I also discussed with the patient that there may be a patient responsible charge related to this service. The patient expressed understanding and agreed to proceed. Subjective:  Carrie RousselHannah Hamilton is a 30 y.o. G2P0010 at 6268w0d being seen today for ongoing prenatal care.  She is currently monitored for the following issues for this low-risk pregnancy and has Right shoulder pain; Left wrist pain; Routine gynecological examination; Folliculitis; Supervision of other normal pregnancy, antepartum; and Rh negative status during pregnancy on their problem list.  Patient reports congestion, body aches, chills.  Contractions: Not present. Vag. Bleeding: None.  Movement: Present. Denies any leaking of fluid.   The following portions of the patient's history were reviewed and updated as appropriate: allergies, current medications, past family history, past medical history, past social history, past surgical history and problem list.   Objective:   Vitals:   01/11/21 0828  BP: 119/76  Pulse: 100  Weight: 199 lb (90.3 kg)    Fetal Status:     Movement: Present     General:  Alert, oriented and cooperative. Patient is in no acute distress.  Respiratory: Normal respiratory effort, no problems with respiration noted  Mental Status: Normal mood and affect. Normal behavior. Normal judgment and thought content.  Rest of physical exam deferred due to type of encounter  Imaging: US MFM OB FOLLOW UP  Result Date:  01/04/2021 ----------------------------------------------------------------------  OBSTETRICS REPORT                       (Signed Final 01/04/2021 02:32 pm) ---------------------------------------------------------------------- Patient Info  ID #:       [id number]                          D.O.B.:  [birth year] (29 yrs)  Name:       Carrie Hamilton                   Visit Date: 01/04/2021 01:18 pm ---------------------------------------------------------------------- Performed By  Attending:        Ma RingsVictor Fang MD         Ref. Address:     Faculty Practice  Performed By:     Tommie RaymondMesha Tester BS,       [facility]                                                             [facility]  Referred By:      Gerrit Heck                    CNM ---------------------------------------------------------------------- Orders  #  Description                           Code        Ordered By  1  [place] MFM OB FOLLOW UP                   [phone]    YU FANG ----------------------------------------------------------------------  #  Order #                     Accession #                Episode #  1  [id number]                    [id number]                 [id number] ---------------------------------------------------------------------- Indications  Encounter for other antenatal screening        Z36.2  follow-up  [redacted] weeks gestation of pregnancy                Z3A.23 ---------------------------------------------------------------------- Fetal Evaluation  Num Of Fetuses:         1  Fetal Heart Rate(bpm):  145  Cardiac Activity:       Observed  Presentation:           Cephalic  Placenta:               Posterior  P. Cord Insertion:      Previously Visualized  Amniotic Fluid  AFI FV:      Within normal limits                              Largest Pocket(cm)                              7.7  ---------------------------------------------------------------------- Biometry  BPD:      58.3  mm     G. Age:  23w 6d         77  %    CI:        70.43   %    70 - 86                                                          FL/HC:      18.8   %    19.2 - 20.8  HC:      221.5  mm     G. Age:  24w 1d         80  %    HC/AC:      1.06        1.05 - 1.21  AC:      208.6  mm     G. Age:  25w 3d         96  %    FL/BPD:     71.5   %  71 - 87  FL:       41.7  mm     G. Age:  23w 4d         58  %    FL/AC:      20.0   %    20 - 24  CER:      26.7  mm     G. Age:  24w 0d         95  %  NFT:       6.2  mm  LV:          7  mm  CM:          8  mm  Est. FW:     708  gm      1 lb 9 oz     97  % ---------------------------------------------------------------------- OB History  Gravidity:    2  Living:       0 ---------------------------------------------------------------------- Gestational Age  LMP:           23w 0d        Date:  07/27/20                 EDD:   05/03/21  U/S Today:     24w 2d                                        EDD:   04/24/21  Best:          23w 0d     Det. By:  LMP  (07/27/20)          EDD:   05/03/21 ---------------------------------------------------------------------- Anatomy  Cranium:               Appears normal         LVOT:                   Appears normal  Cavum:                 Appears normal         Aortic Arch:            Appears normal  Ventricles:            Appears normal         Ductal Arch:            Appears normal  Choroid Plexus:        Appears normal         Diaphragm:              Appears normal  Cerebellum:            Appears normal         Stomach:                Appears normal, left                                                                        sided  Posterior Fossa:       Appears normal  Abdomen:                Appears normal  Nuchal Fold:           Previously seen        Abdominal Wall:         Appears nml (cord                                                                         insert, abd wall)  Face:                  Appears normal         Cord Vessels:           Appears normal (3                         (orbits and profile)                           vessel cord)  Lips:                  Appears normal         Kidneys:                Appear normal  Palate:                Not well visualized    Bladder:                Appears normal  Thoracic:              Appears normal         Spine:                  Appears normal  Heart:                 Appears normal         Upper Extremities:      Previously seen                         (4CH, axis, and                         situs)  RVOT:                  Appears normal         Lower Extremities:      Previously seen  Other:  Female gender previously seen. Nasal bone, Lenses, hands and feet          visualized previously. Technically difficult due to fetal position. ---------------------------------------------------------------------- Cervix Uterus Adnexa  Cervix  Length:           3.88  cm.  Not visualized (advanced GA >24wks)  Uterus  No abnormality visualized.  Right Ovary  Within normal limits.  Left Ovary  Within normal limits.  Cul De Sac  No free fluid seen.  Adnexa  No abnormality visualized. ---------------------------------------------------------------------- Comments  This patient was seen for a follow up exam as the views of  the fetal anatomy were  unable to be fully visualized during  her last exam.  She denies any problems since her last exam.  She was informed that the fetal growth and amniotic fluid  level appears appropriate for her gestational age.  The views of the fetal anatomy were visualized today.  There  were no obvious anomalies noted.  The limitations of ultrasound in the detection of all anomalies  was discussed.  Follow-up as indicated. ----------------------------------------------------------------------                   Ma RingsVictor Fang, MD Electronically Signed Final Report   01/04/2021 02:32 pm  ----------------------------------------------------------------------   Assessment and Plan:  Pregnancy: G2P0010 at 3250w0d 1. Supervision of other normal pregnancy, antepartum - Routine prenatal care - Anticipatory guidance on upcoming appointments  - waterbirth class scheduled for March  - educated and discussed RLP and used of maternity support belt   2. [redacted] weeks gestation of pregnancy - discussed GTT at next appointment, discussed presenting to appointment fasting after MN   3. Nasal congestion - Patient reports symptoms started about 3 days  - recommended covid testing to patient - patient reports that both her and husband work from home so they are just going to [employer]  - Educated and discussed safe medications during pregnancy, list sent through [employer]    Preterm labor symptoms and general obstetric precautions including but not limited to vaginal bleeding, contractions, leaking of fluid and fetal movement were reviewed in detail with the patient. I discussed the assessment and treatment plan with the patient. The patient was provided an opportunity to ask questions and all were answered. The patient agreed with the plan and demonstrated an understanding of the instructions. The patient was advised to call back or seek an in-person office evaluation/[facility] for any urgent or concerning symptoms. Please refer to After Visit Summary for other counseling recommendations.   I provided 12 minutes of face-to-face time during this encounter.  Return in about 4 weeks (around 02/08/2021) for LROB, GTT, in person.  Sharyon CableVeronica C Aven Cegielski, CNM [facility], [facility]

## 2021-01-11 NOTE — Progress Notes (Signed)
Pt has covid symtoms for the past 3 days (congestion, fever, chills, body aches). I recommended pt to go for covid test and pt states her and her husband work from home and have not gotten tested

## 2021-02-08 ENCOUNTER — Encounter: Payer: Self-pay | Admitting: Certified Nurse Midwife

## 2021-02-08 ENCOUNTER — Ambulatory Visit (INDEPENDENT_AMBULATORY_CARE_PROVIDER_SITE_OTHER): Payer: Commercial Managed Care - PPO | Admitting: Certified Nurse Midwife

## 2021-02-08 ENCOUNTER — Other Ambulatory Visit: Payer: Self-pay

## 2021-02-08 VITALS — BP 134/88 | HR 110 | Wt 208.0 lb

## 2021-02-08 DIAGNOSIS — Z6791 Unspecified blood type, Rh negative: Secondary | ICD-10-CM

## 2021-02-08 DIAGNOSIS — O26893 Other specified pregnancy related conditions, third trimester: Secondary | ICD-10-CM | POA: Diagnosis not present

## 2021-02-08 DIAGNOSIS — Z3A28 28 weeks gestation of pregnancy: Secondary | ICD-10-CM

## 2021-02-08 DIAGNOSIS — Z3480 Encounter for supervision of other normal pregnancy, unspecified trimester: Secondary | ICD-10-CM | POA: Diagnosis not present

## 2021-02-08 LAB — CBC
Hemoglobin: 12.6 g/dL (ref 11.7–15.5)
RDW: 14 % (ref 11.0–15.0)

## 2021-02-08 MED ORDER — RHO D IMMUNE GLOBULIN 1500 UNIT/2ML IJ SOSY
300.0000 ug | PREFILLED_SYRINGE | Freq: Once | INTRAMUSCULAR | Status: AC
  Administered 2021-02-08: 300 ug via INTRAMUSCULAR

## 2021-02-08 NOTE — Patient Instructions (Signed)
Oral Glucose Tolerance Test During Pregnancy Why am I having this test? The oral glucose tolerance test (OGTT) is done to check how your body processes blood sugar (glucose). This is one of several tests used to diagnose diabetes that develops during pregnancy (gestational diabetes mellitus). Gestational diabetes is a short-term form of diabetes that some women develop while they are pregnant. It usually occurs during the second trimester of pregnancy and goes away after delivery. Testing, or screening, for gestational diabetes usually occurs at weeks 24-28 of pregnancy. You may have the OGTT test after having a 1-hour glucose screening test if the results from that test indicate that you may have gestational diabetes. This test may also be needed if:  You have a history of gestational diabetes.  There is a history of giving birth to very large babies or of losing pregnancies (having stillbirths).  You have signs and symptoms of diabetes, such as: ? Changes in your eyesight. ? Tingling or numbness in your hands or feet. ? Changes in hunger, thirst, and urination, and these are not explained by your pregnancy. What is being tested? This test measures the amount of glucose in your blood at different times during a period of 3 hours. This shows how well your body can process glucose. What kind of sample is taken? Blood samples are required for this test. They are usually collected by inserting a needle into a blood vessel.   How do I prepare for this test?  For 3 days before your test, eat normally. Have plenty of carbohydrate-rich foods.  Follow instructions from your health care provider about: ? Eating or drinking restrictions on the day of the test. You may be asked not to eat or drink anything other than water (to fast) starting 8-10 hours before the test. ? Changing or stopping your regular medicines. Some medicines may interfere with this test. Tell a health care provider about:  All  medicines you are taking, including vitamins, herbs, eye drops, creams, and over-the-counter medicines.  Any blood disorders you have.  Any surgeries you have had.  Any medical conditions you have. What happens during the test? First, your blood glucose will be measured. This is referred to as your fasting blood glucose because you fasted before the test. Then, you will drink a glucose solution that contains a certain amount of glucose. Your blood glucose will be measured again 1, 2, and 3 hours after you drink the solution. This test takes about 3 hours to complete. You will need to stay at the testing location during this time. During the testing period:  Do not eat or drink anything other than the glucose solution.  Do not exercise.  Do not use any products that contain nicotine or tobacco, such as cigarettes, e-cigarettes, and chewing tobacco. These can affect your test results. If you need help quitting, ask your health care provider. The testing procedure may vary among health care providers and hospitals. How are the results reported? Your results will be reported as milligrams of glucose per deciliter of blood (mg/dL) or millimoles per liter (mmol/L). There is more than one source for screening and diagnosis reference values used to diagnose gestational diabetes. Your health care provider will compare your results to normal values that were established after testing a large group of people (reference values). Reference values may vary among labs and hospitals. For this test (Carpenter-Coustan), reference values are:  Fasting: 95 mg/dL (5.3 mmol/L).  1 hour: 180 mg/dL (10.0 mmol/L).  2 hour:   155 mg/dL (8.6 mmol/L).  3 hour: 140 mg/dL (7.8 mmol/L). What do the results mean? Results below the reference values are considered normal. If two or more of your blood glucose levels are at or above the reference values, you may be diagnosed with gestational diabetes. If only one level is  high, your health care provider may suggest repeat testing or other tests to confirm a diagnosis. Talk with your health care provider about what your results mean. Questions to ask your health care provider Ask your health care provider, or the department that is doing the test:  When will my results be ready?  How will I get my results?  What are my treatment options?  What other tests do I need?  What are my next steps? Summary  The oral glucose tolerance test (OGTT) is one of several tests used to diagnose diabetes that develops during pregnancy (gestational diabetes mellitus). Gestational diabetes is a short-term form of diabetes that some women develop while they are pregnant.  You may have the OGTT test after having a 1-hour glucose screening test if the results from that test show that you may have gestational diabetes. You may also have this test if you have any symptoms or risk factors for this type of diabetes.  Talk with your health care provider about what your results mean. This information is not intended to replace advice given to you by your health care provider. Make sure you discuss any questions you have with your health care provider. Document Revised: 05/13/2020 Document Reviewed: 05/13/2020 Elsevier Patient Education  2021 Elsevier Inc.  

## 2021-02-08 NOTE — Progress Notes (Signed)
   PRENATAL VISIT NOTE  Subjective:  Carrie Hamilton is a 30 y.o. G2P0010 at [redacted]w[redacted]d being seen today for ongoing prenatal care.  She is currently monitored for the following issues for this low-risk pregnancy and has Right shoulder pain; Left wrist pain; Routine gynecological examination; Folliculitis; Supervision of other normal pregnancy, antepartum; and Rh negative status during pregnancy on their problem list.  Patient reports ankle swelling.  Contractions: Not present. Vag. Bleeding: None.  Movement: Present. Denies leaking of fluid.   The following portions of the patient's history were reviewed and updated as appropriate: allergies, current medications, past family history, past medical history, past social history, past surgical history and problem list.   Objective:   Vitals:   02/08/21 0805  BP: 134/88  Pulse: (!) 110  Weight: 208 lb (94.3 kg)    Fetal Status: Fetal Heart Rate (bpm): 151 Fundal Height: 32 cm Movement: Present     General:  Alert, oriented and cooperative. Patient is in no acute distress.  Skin: Skin is warm and dry. No rash noted.   Cardiovascular: Normal heart rate noted  Respiratory: Normal respiratory effort, no problems with respiration noted  Abdomen: Soft, gravid, appropriate for gestational age.  Pain/Pressure: Absent     Pelvic: Cervical exam deferred        Extremities: Normal range of motion.  Edema: Trace  Mental Status: Normal mood and affect. Normal behavior. Normal judgment and thought content.   Assessment and Plan:  Pregnancy: G2P0010 at [redacted]w[redacted]d 1. Supervision of other normal pregnancy, antepartum - Patient doing well, reports occasional ankle swelling especially after she finishes 2 mile walk - educated and discussed with patient use of compression socks while at work and on walk then elevate legs when she gets home  - Routine prenatal care - Anticipatory guidance on upcoming appointments  - Educated and discussed GTT done today and possible  results with what they mean  - rho (d) immune globulin (RHIG/RHOPHYLAC) injection 300 mcg - Antibody screen - HIV antibody (with reflex) - CBC - RPR - 2Hr GTT w/ 1 Hr Carpenter 75 g  2. Rh negative status during pregnancy in third trimester - Rhogam given today   3. [redacted] weeks gestation of pregnancy - FH > for GA, continue to monitor closely, if continues to be large then will need follow up US for fetal growth around 34 weeks   Preterm labor symptoms and general obstetric precautions including but not limited to vaginal bleeding, contractions, leaking of fluid and fetal movement were reviewed in detail with the patient. Please refer to After Visit Summary for other counseling recommendations.   Return in about 2 weeks (around 02/22/2021) for LROB, virtual.  Future Appointments  Date Time Provider Department Center  02/22/2021 10:40 AM Leftwich-Kirby, Wilmer Floor, CNM [facility]    Sharyon Cable, CNM

## 2021-02-09 LAB — CBC
HCT: 36.8 % (ref 35.0–45.0)
MCH: 30.5 pg (ref 27.0–33.0)
MCHC: 34.2 g/dL (ref 32.0–36.0)
MCV: 89.1 fL (ref 80.0–100.0)
MPV: 10.1 fL (ref 7.5–12.5)
Platelets: 251 10*3/uL (ref 140–400)
RBC: 4.13 10*6/uL (ref 3.80–5.10)
WBC: 12.5 10*3/uL — ABNORMAL HIGH (ref 3.8–10.8)

## 2021-02-09 LAB — 2HR GTT W 1 HR, CARPENTER, 75 G
Glucose, 1 Hr, Gest: 142 mg/dL (ref 65–179)
Glucose, 2 Hr, Gest: 106 mg/dL (ref 65–152)
Glucose, Fasting, Gest: 78 mg/dL (ref 65–91)

## 2021-02-09 LAB — RPR: RPR Ser Ql: NONREACTIVE

## 2021-02-09 LAB — HIV ANTIBODY (ROUTINE TESTING W REFLEX): HIV 1&2 Ab, 4th Generation: NONREACTIVE

## 2021-02-09 LAB — ANTIBODY SCREEN: Antibody Screen: NOT DETECTED

## 2021-02-22 ENCOUNTER — Telehealth (INDEPENDENT_AMBULATORY_CARE_PROVIDER_SITE_OTHER): Payer: Commercial Managed Care - PPO | Admitting: Advanced Practice Midwife

## 2021-02-22 VITALS — BP 112/81 | Wt 209.0 lb

## 2021-02-22 DIAGNOSIS — Z3A30 30 weeks gestation of pregnancy: Secondary | ICD-10-CM

## 2021-02-22 DIAGNOSIS — G479 Sleep disorder, unspecified: Secondary | ICD-10-CM

## 2021-02-22 DIAGNOSIS — O1203 Gestational edema, third trimester: Secondary | ICD-10-CM

## 2021-02-22 DIAGNOSIS — Z3480 Encounter for supervision of other normal pregnancy, unspecified trimester: Secondary | ICD-10-CM

## 2021-02-22 DIAGNOSIS — O26893 Other specified pregnancy related conditions, third trimester: Secondary | ICD-10-CM

## 2021-02-22 DIAGNOSIS — Z6791 Unspecified blood type, Rh negative: Secondary | ICD-10-CM

## 2021-02-22 NOTE — Progress Notes (Signed)
   OBSTETRICS PRENATAL VIRTUAL VISIT ENCOUNTER NOTE  Provider location: [facility] [place]   I connected with Sharol Roussel on 02/22/21 at 10:40 AM EST by MyChart Video Encounter at home and verified that I am speaking with the correct person using two identifiers.   I discussed the limitations, risks, security and privacy concerns of performing an evaluation and management service virtually and the availability of in person appointments. I also discussed with the patient that there may be a patient responsible charge related to this service. The patient expressed understanding and agreed to proceed. Subjective:  Carrie Hamilton is a 30 y.o. G2P0010 at [redacted]w[redacted]d being seen today for ongoing prenatal care.  She is currently monitored for the following issues for this low-risk pregnancy and has Supervision of other normal pregnancy, antepartum and Rh negative status during pregnancy on their problem list.  Patient reports swelling of feet/ankles and difficulty sleeping on her back due to shoulder pain.  Contractions: Not present. Vag. Bleeding: None.  Movement: Present. Denies any leaking of fluid.   The following portions of the patient's history were reviewed and updated as appropriate: allergies, current medications, past family history, past medical history, past social history, past surgical history and problem list.   Objective:   Vitals:   02/22/21 1023  BP: 112/81  Weight: 209 lb (94.8 kg)    Fetal Status:     Movement: Present     General:  Alert, oriented and cooperative. Patient is in no acute distress.  Respiratory: Normal respiratory effort, no problems with respiration noted  Mental Status: Normal mood and affect. Normal behavior. Normal judgment and thought content.  Rest of physical exam deferred due to type of encounter  Imaging: No results found.  Assessment and Plan:  Pregnancy: G2P0010 at [redacted]w[redacted]d 1. Supervision of other normal pregnancy,  antepartum --Anticipatory guidance about next visits/weeks of pregnancy given.  2. Rh negative status during pregnancy in third trimester --Rhogam given 02/08/21   3. [redacted] weeks gestation of pregnancy  4. Edema during pregnancy in third trimester --BP wnl, 112/81 on 02/18/21 --Elevate when sitting at desk for work, wear compression socks  5. Sleep disturbance --Pt with hx shoulder issues, difficult to sleep on her side. Try wedge pillow to sleep on her back but up at 45 degree angle.    Preterm labor symptoms and general obstetric precautions including but not limited to vaginal bleeding, contractions, leaking of fluid and fetal movement were reviewed in detail with the patient. I discussed the assessment and treatment plan with the patient. The patient was provided an opportunity to ask questions and all were answered. The patient agreed with the plan and demonstrated an understanding of the instructions. The patient was advised to call back or seek an in-person office evaluation/go to MAU at [facility] for any urgent or concerning symptoms. Please refer to After Visit Summary for other counseling recommendations.   I provided 15 minutes of face-to-face time during this encounter.  Return in about 4 weeks (around 03/22/2021).  No future appointments.  Sharen Counter, CNM [facility] [employer], [facility]

## 2021-03-09 ENCOUNTER — Encounter: Payer: Self-pay | Admitting: *Deleted

## 2021-03-22 ENCOUNTER — Ambulatory Visit (INDEPENDENT_AMBULATORY_CARE_PROVIDER_SITE_OTHER): Payer: Commercial Managed Care - PPO | Admitting: Advanced Practice Midwife

## 2021-03-22 ENCOUNTER — Other Ambulatory Visit: Payer: Self-pay

## 2021-03-22 VITALS — BP 134/87 | HR 123 | Wt 215.0 lb

## 2021-03-22 DIAGNOSIS — Z3A34 34 weeks gestation of pregnancy: Secondary | ICD-10-CM

## 2021-03-22 DIAGNOSIS — Z3480 Encounter for supervision of other normal pregnancy, unspecified trimester: Secondary | ICD-10-CM

## 2021-03-22 DIAGNOSIS — O26893 Other specified pregnancy related conditions, third trimester: Secondary | ICD-10-CM

## 2021-03-22 DIAGNOSIS — O1203 Gestational edema, third trimester: Secondary | ICD-10-CM

## 2021-03-22 DIAGNOSIS — Z6791 Unspecified blood type, Rh negative: Secondary | ICD-10-CM

## 2021-03-22 NOTE — Progress Notes (Signed)
   PRENATAL VISIT NOTE  Subjective:  Carrie Hamilton is a 30 y.o. G2P0010 at [redacted]w[redacted]d being seen today for ongoing prenatal care.  She is currently monitored for the following issues for this low-risk pregnancy and has Supervision of other normal pregnancy, antepartum and Rh negative status during pregnancy on their problem list.  Patient reports edema of feet/ankles, some in hands.  Contractions: Not present. Vag. Bleeding: None.  Movement: Present. Denies leaking of fluid.   The following portions of the patient's history were reviewed and updated as appropriate: allergies, current medications, past family history, past medical history, past social history, past surgical history and problem list.   Objective:   Vitals:   03/22/21 0831  BP: 134/87  Pulse: (!) 123  Weight: 215 lb (97.5 kg)    Fetal Status: Fetal Heart Rate (bpm): 140 Fundal Height: 35 cm Movement: Present  Presentation: Vertex  General:  Alert, oriented and cooperative. Patient is in no acute distress.  Skin: Skin is warm and dry. No rash noted.   Cardiovascular: Normal heart rate noted  Respiratory: Normal respiratory effort, no problems with respiration noted  Abdomen: Soft, gravid, appropriate for gestational age.  Pain/Pressure: Absent     Pelvic: Cervical exam deferred        Extremities: Normal range of motion.  Edema: Trace  Mental Status: Normal mood and affect. Normal behavior. Normal judgment and thought content.   Assessment and Plan:  Pregnancy: G2P0010 at [redacted]w[redacted]d 1. [redacted] weeks gestation of pregnancy   2. Supervision of other normal pregnancy, antepartum --Anticipatory guidance about next visits/weeks of pregnancy given. --Desires waterbirth, consent signed today. Reviewed contraindications, things that may develop that prevent immersion in waterbirth tub.  Pt states understanding. --Pt has birth plan and questions today. Reviewed birth plan with pt, who will make changes/updates and bring copy to next visit.     3. Rh negative status during pregnancy in third trimester --Rhogam received.  4. Edema during pregnancy in third trimester --BP  Normal, high normal, but normal today.  Pt taking BP at home and BP 120/80 or below consistently at home. --No s/sx of PEC --Continue home BP 1-2 time weekly --Next visit in 2 weeks in office --Reviewed s/sx of PEC/reasons to seek care  Preterm labor symptoms and general obstetric precautions including but not limited to vaginal bleeding, contractions, leaking of fluid and fetal movement were reviewed in detail with the patient. Please refer to After Visit Summary for other counseling recommendations.   Return in about 2 weeks (around 04/05/2021).  Future Appointments  Date Time Provider Department Center  04/08/2021  8:50 AM Donette Larry, CNM [facility]    Sharen Counter, CNM

## 2021-04-08 ENCOUNTER — Ambulatory Visit (INDEPENDENT_AMBULATORY_CARE_PROVIDER_SITE_OTHER): Payer: Commercial Managed Care - PPO | Admitting: Certified Nurse Midwife

## 2021-04-08 ENCOUNTER — Other Ambulatory Visit: Payer: Self-pay

## 2021-04-08 ENCOUNTER — Other Ambulatory Visit (HOSPITAL_COMMUNITY)
Admission: RE | Admit: 2021-04-08 | Discharge: 2021-04-08 | Disposition: A | Discharge status: Home / Self Care | Payer: Commercial Managed Care - PPO | Source: Ambulatory Visit | Attending: Certified Nurse Midwife | Admitting: Certified Nurse Midwife

## 2021-04-08 VITALS — BP 128/87 | HR 112 | Wt 215.0 lb

## 2021-04-08 DIAGNOSIS — Z3483 Encounter for supervision of other normal pregnancy, third trimester: Secondary | ICD-10-CM

## 2021-04-08 DIAGNOSIS — Z3480 Encounter for supervision of other normal pregnancy, unspecified trimester: Secondary | ICD-10-CM | POA: Diagnosis not present

## 2021-04-08 DIAGNOSIS — Z3A36 36 weeks gestation of pregnancy: Secondary | ICD-10-CM

## 2021-04-08 LAB — OB RESULTS CONSOLE GBS: GBS: POSITIVE

## 2021-04-08 NOTE — Progress Notes (Signed)
Subjective:  Carrie Hamilton is a 30 y.o. G2P0010 at 100w3d being seen today for ongoing prenatal care.  She is currently monitored for the following issues for this low-risk pregnancy and has Supervision of other normal pregnancy, antepartum and Rh negative status during pregnancy on their problem list.  Patient reports no complaints.  Contractions: Not present. Vag. Bleeding: None.  Movement: Present. Denies leaking of fluid.   The following portions of the patient's history were reviewed and updated as appropriate: allergies, current medications, past family history, past medical history, past social history, past surgical history and problem list. Problem list updated.  Objective:   Vitals:   04/08/21 0848  BP: 128/87  Pulse: (!) 112  Weight: 215 lb (97.5 kg)    Fetal Status: Fetal Heart Rate (bpm): 140 Fundal Height: 38 cm Movement: Present  Presentation: Vertex  General:  Alert, oriented and cooperative. Patient is in no acute distress.  Skin: Skin is warm and dry. No rash noted.   Cardiovascular: Normal heart rate noted  Respiratory: Normal respiratory effort, no problems with respiration noted  Abdomen: Soft, gravid, appropriate for gestational age. Pain/Pressure: Present     Pelvic: Vag. Bleeding: None Vag D/C Character: Thick   Cervical exam deferred        Extremities: Normal range of motion.  Edema: Trace  Mental Status: Normal mood and affect. Normal behavior. Normal judgment and thought content.   Urinalysis:      Assessment and Plan:  Pregnancy: G2P0010 at [redacted]w[redacted]d  1. Supervision of other normal pregnancy, antepartum - Culture, beta strep (group b only) - Cervicovaginal ancillary only( [place]) - BP borderline> discussed PEC sx, has BP cuff at home, call for <140/90 - planning WB  2. [redacted] weeks gestation   Term labor symptoms and general obstetric precautions including but not limited to vaginal bleeding, contractions, leaking of fluid and fetal movement were  reviewed in detail with the patient. Please refer to After Visit Summary for other counseling recommendations.  Return in about 2 weeks (around 04/22/2021).   Donette Larry, CNM

## 2021-04-08 NOTE — Patient Instructions (Signed)
[employer]'s Emergency Medicine: Concepts and Clinical Practice (9th ed., pp. 2296- 2312). Elsevier.">  Braxton Hicks Contractions Contractions of the uterus can occur throughout pregnancy, but they are not always a sign that you are in labor. You may have practice contractions called Braxton Hicks contractions. These false labor contractions are sometimes confused with true labor. What are Braxton Hicks contractions? Braxton Hicks contractions are tightening movements that occur in the muscles of the uterus before labor. Unlike true labor contractions, these contractions do not result in opening (dilation) and thinning of the cervix. Toward the end of pregnancy (32-34 weeks), Braxton Hicks contractions can happen more often and may become stronger. These contractions are sometimes difficult to tell apart from true labor because they can be very uncomfortable. You should not feel embarrassed if you go to the hospital with false labor. Sometimes, the only way to tell if you are in true labor is for your health care provider to look for changes in the cervix. The health care provider will do a physical exam and may monitor your contractions. If you are not in true labor, the exam should show that your cervix is not dilating and your water has not broken. If there are no other health problems associated with your pregnancy, it is completely safe for you to be sent home with false labor. You may continue to have Braxton Hicks contractions until you go into true labor. How to tell the difference between true labor and false labor True labor  Contractions last 30-70 seconds.  Contractions become very regular.  Discomfort is usually felt in the top of the uterus, and it spreads to the lower abdomen and low back.  Contractions do not go away with walking.  Contractions usually become more intense and increase in frequency.  The cervix dilates and gets thinner. False labor  Contractions are usually shorter  and not as strong as true labor contractions.  Contractions are usually irregular.  Contractions are often felt in the front of the lower abdomen and in the groin.  Contractions may go away when you walk around or change positions while lying down.  Contractions get weaker and are shorter-lasting as time goes on.  The cervix usually does not dilate or become thin. Follow these instructions at home:  Take over-the-counter and prescription medicines only as told by your health care provider.  Keep up with your usual exercises and follow other instructions from your health care provider.  Eat and drink lightly if you think you are going into labor.  If Braxton Hicks contractions are making you uncomfortable: ? Change your position from lying down or resting to walking, or change from walking to resting. ? Sit and rest in a tub of warm water. ? Drink enough fluid to keep your urine pale yellow. Dehydration may cause these contractions. ? Do slow and deep breathing several times an hour.  Keep all follow-up prenatal visits as told by your health care provider. This is important.   Contact a health care provider if:  You have a fever.  You have continuous pain in your abdomen. Get help right away if:  Your contractions become stronger, more regular, and closer together.  You have fluid leaking or gushing from your vagina.  You pass blood-tinged mucus (bloody show).  You have bleeding from your vagina.  You have low back pain that you never had before.  You feel your baby's head pushing down and causing pelvic pressure.  Your baby is not moving inside   you as much as it used to. Summary  Contractions that occur before labor are called Braxton Hicks contractions, false labor, or practice contractions.  Braxton Hicks contractions are usually shorter, weaker, farther apart, and less regular than true labor contractions. True labor contractions usually become progressively  stronger and regular, and they become more frequent.  Manage discomfort from Braxton Hicks contractions by changing position, resting in a warm bath, drinking plenty of water, or practicing deep breathing. This information is not intended to replace advice given to you by your health care provider. Make sure you discuss any questions you have with your health care provider. Document Revised: 11/16/2017 Document Reviewed: 04/19/2017 Elsevier Patient Education  2021 [employer] Inc.   Fetal Movement Counts Patient Name: ________________________________________________ Patient Due Date: ____________________  What is a fetal movement count? A fetal movement count is the number of times that you feel your baby move during a certain amount of time. This may also be called a fetal kick count. A fetal movement count is recommended for every pregnant woman. You may be asked to start counting fetal movements as early as week 28 of your pregnancy. Pay attention to when your baby is most active. You may notice your baby's sleep and wake cycles. You may also notice things that make your baby move more. You should do a fetal movement count:  When your baby is normally most active.  At the same time each day. A good time to count movements is while you are resting, after having something to eat and drink. How do I count fetal movements? 1. Find a quiet, comfortable area. Sit, or lie down on your side. 2. Write down the date, the start time and stop time, and the number of movements that you felt between those two times. Take this information with you to your health care visits. 3. Write down your start time when you feel the first movement. 4. Count kicks, flutters, swishes, rolls, and jabs. You should feel at least 10 movements. 5. You may stop counting after you have felt 10 movements, or if you have been counting for 2 hours. Write down the stop time. 6. If you do not feel 10 movements in 2 hours, contact  your health care provider for further instructions. Your health care provider may want to do additional tests to assess your baby's well-being. Contact a health care provider if:  You feel fewer than 10 movements in 2 hours.  Your baby is not moving like he or she usually does. Date: ____________ Start time: ____________ Stop time: ____________ Movements: ____________ Date: ____________ Start time: ____________ Stop time: ____________ Movements: ____________ Date: ____________ Start time: ____________ Stop time: ____________ Movements: ____________ Date: ____________ Start time: ____________ Stop time: ____________ Movements: ____________ Date: ____________ Start time: ____________ Stop time: ____________ Movements: ____________ Date: ____________ Start time: ____________ Stop time: ____________ Movements: ____________ Date: ____________ Start time: ____________ Stop time: ____________ Movements: ____________ Date: ____________ Start time: ____________ Stop time: ____________ Movements: ____________ Date: ____________ Start time: ____________ Stop time: ____________ Movements: ____________ This information is not intended to replace advice given to you by your health care provider. Make sure you discuss any questions you have with your health care provider. Document Revised: 07/24/2019 Document Reviewed: 07/24/2019 Elsevier Patient Education  2021 Elsevier Inc.  

## 2021-04-10 ENCOUNTER — Encounter: Payer: Self-pay | Admitting: Certified Nurse Midwife

## 2021-04-10 DIAGNOSIS — O99820 Streptococcus B carrier state complicating pregnancy: Secondary | ICD-10-CM | POA: Insufficient documentation

## 2021-04-10 LAB — CULTURE, BETA STREP (GROUP B ONLY)
MICRO NUMBER:: 11803067
SPECIMEN QUALITY:: ADEQUATE

## 2021-04-11 LAB — CERVICOVAGINAL ANCILLARY ONLY
Chlamydia: NEGATIVE
Comment: NEGATIVE
Comment: NORMAL
Neisseria Gonorrhea: NEGATIVE

## 2021-04-19 ENCOUNTER — Other Ambulatory Visit: Payer: Self-pay

## 2021-04-19 ENCOUNTER — Ambulatory Visit (INDEPENDENT_AMBULATORY_CARE_PROVIDER_SITE_OTHER): Payer: Commercial Managed Care - PPO | Admitting: Advanced Practice Midwife

## 2021-04-19 VITALS — BP 143/84 | HR 114 | Wt 215.0 lb

## 2021-04-19 DIAGNOSIS — Z3480 Encounter for supervision of other normal pregnancy, unspecified trimester: Secondary | ICD-10-CM

## 2021-04-19 DIAGNOSIS — Z3A38 38 weeks gestation of pregnancy: Secondary | ICD-10-CM

## 2021-04-19 NOTE — Patient Instructions (Signed)
Things to Try After 37 weeks to Encourage Labor/Get Ready for Labor:   1.  Try the Miles Circuit at www.milescircuit.com daily to improve baby's position and encourage the onset of labor.  2. Walk a little and rest a little every day.  Change positions often.  3. Cervical Ripening: May try one or both a. Red Raspberry Leaf capsules or tea:  two 300mg or 400mg tablets with each meal, 2-3 times a day, or 1-3 cups of tea daily  Potential Side Effects Of Raspberry Leaf:  Most women do not experience any side effects from drinking raspberry leaf tea. However, nausea and loose stools are possible   b. Evening Primrose Oil capsules: take 1 capsule by mouth and place one capsule in the vagina every night.    Some of the potential side effects:  Upset stomach  Loose stools or diarrhea  Headaches  Nausea  4. Sex can also help the cervix ripen and encourage labor onset.    Labor Precautions Reasons to come to [facility] at [facility]:  1.  Contractions are  5 minutes apart or less, each last 1 minute, these have been going on for 1-2 hours, and you cannot walk or talk during them 2.  You have a large gush of fluid, or a trickle of fluid that will not stop and you have to wear a pad 3.  You have bleeding that is bright red, heavier than spotting--like menstrual bleeding (spotting can be normal in early labor or after a check of your cervix) 4.  You do not feel the baby moving like he/she normally does 

## 2021-04-19 NOTE — Progress Notes (Signed)
Pt denies headache or visual changes. Pt states coming to the doctor's office makes her BP high

## 2021-04-19 NOTE — Progress Notes (Addendum)
   PRENATAL VISIT NOTE  Subjective:  Carrie Hamilton is a 30 y.o. G2P0010 at [redacted]w[redacted]d being seen today for ongoing prenatal care.  She is currently monitored for the following issues for this low-risk pregnancy and has Supervision of other normal pregnancy, antepartum; Rh negative status during pregnancy; and GBS (group B Streptococcus carrier), +RV culture, currently pregnant on their problem list.  Patient reports no complaints.  Contractions: Not present. Vag. Bleeding: None.  Movement: Present. Denies leaking of fluid.   The following portions of the patient's history were reviewed and updated as appropriate: allergies, current medications, past family history, past medical history, past social history, past surgical history and problem list.   Objective:   Vitals:   04/19/21 0852  BP: (!) 143/84  Pulse: (!) 114  Weight: 215 lb (97.5 kg)    Fetal Status: Fetal Heart Rate (bpm): 137 Fundal Height: 38 cm Movement: Present     General:  Alert, oriented and cooperative. Patient is in no acute distress.  Skin: Skin is warm and dry. No rash noted.   Cardiovascular: Normal heart rate noted  Respiratory: Normal respiratory effort, no problems with respiration noted  Abdomen: Soft, gravid, appropriate for gestational age.  Pain/Pressure: Present     Pelvic: Cervical exam deferred        Extremities: Normal range of motion.  Edema: Trace  Mental Status: Normal mood and affect. Normal behavior. Normal judgment and thought content.   Assessment and Plan:  Pregnancy: G2P0010 at [redacted]w[redacted]d 1. Supervision of other normal pregnancy, antepartum --Pt with reported white coat syndrome.  Single elevated BP at 16 weeks, all normotensive since then.  Intake BP elevated today at 143/84.  Retake 121/84.  Given that these 2 BPs are very remote from each other, no formal dx of CHTN or GHTN made.  Pt without s/sx of PEC.  Discussed with pt today, who is aware that any additional BP will give her dx of GHTN and that  waterbirth will not be available. This may occur in pregnancy or on admission in labor.  If this occurs, pt may still labor without pain medication, use shower, birth ball, etc but immersion in water requires intermittent monitoring with doppler.   --BP check next week, pt to take BP at home and report log   2. [redacted] weeks gestation of pregnancy   Term labor symptoms and general obstetric precautions including but not limited to vaginal bleeding, contractions, leaking of fluid and fetal movement were reviewed in detail with the patient. Please refer to After Visit Summary for other counseling recommendations.   Return in about 1 week (around 04/26/2021).  Future Appointments  Date Time Provider Department Center  04/29/2021  9:30 AM Donette Larry, CNM [facility]    Sharen Counter, CNM

## 2021-04-29 ENCOUNTER — Other Ambulatory Visit: Payer: Self-pay

## 2021-04-29 ENCOUNTER — Ambulatory Visit (INDEPENDENT_AMBULATORY_CARE_PROVIDER_SITE_OTHER): Payer: Commercial Managed Care - PPO | Admitting: Certified Nurse Midwife

## 2021-04-29 ENCOUNTER — Other Ambulatory Visit: Payer: Self-pay | Admitting: Certified Nurse Midwife

## 2021-04-29 VITALS — BP 131/83 | HR 115 | Wt 218.0 lb

## 2021-04-29 DIAGNOSIS — O133 Gestational [pregnancy-induced] hypertension without significant proteinuria, third trimester: Secondary | ICD-10-CM

## 2021-04-29 DIAGNOSIS — O139 Gestational [pregnancy-induced] hypertension without significant proteinuria, unspecified trimester: Secondary | ICD-10-CM | POA: Insufficient documentation

## 2021-04-29 DIAGNOSIS — Z3480 Encounter for supervision of other normal pregnancy, unspecified trimester: Secondary | ICD-10-CM

## 2021-04-29 NOTE — Progress Notes (Signed)
Subjective:  Carrie Hamilton is a 30 y.o. G2P0010 at [redacted]w[redacted]d being seen today for ongoing prenatal care.  She is currently monitored for the following issues for this high-risk pregnancy and has Supervision of other normal pregnancy, antepartum; Rh negative status during pregnancy; GBS (group B Streptococcus carrier), +RV culture, currently pregnant; and Gestational hypertension on their problem list.  Patient reports no complaints.  Contractions: Not present. Vag. Bleeding: None.  Movement: Present. Denies leaking of fluid.   The following portions of the patient's history were reviewed and updated as appropriate: allergies, current medications, past family history, past medical history, past social history, past surgical history and problem list. Problem list updated.  Objective:   Vitals:   04/29/21 0939 04/29/21 0942  BP: 139/81 131/83  Pulse: (!) 104 (!) 115  Weight: 218 lb (98.9 kg)     Fetal Status: Fetal Heart Rate (bpm): 131 Fundal Height: 39 cm Movement: Present  Presentation: Vertex  General:  Alert, oriented and cooperative. Patient is in no acute distress.  Skin: Skin is warm and dry. No rash noted.   Cardiovascular: Normal heart rate noted  Respiratory: Normal respiratory effort, no problems with respiration noted  Abdomen: Soft, gravid, appropriate for gestational age. Pain/Pressure: Present     Pelvic: Vag. Bleeding: None Vag D/C Character: Thin   Cervical exam performed Dilation: Closed Effacement (%): 50 Station: -3  Extremities: Normal range of motion.  Edema: Trace  Mental Status: Normal mood and affect. Normal behavior. Normal judgment and thought content.   Urinalysis:      Assessment and Plan:  Pregnancy: G2P0010 at [redacted]w[redacted]d  1. Gestational hypertension, third trimester - meets criteria (elevated BP 5/3 and 5/8-babyRx) - recommend IOL, scheduled 5/15 am - informed WB not longer option - continue home BP monitoring, call with elevations - PEC precautions  2.  Supervision of other normal pregnancy, antepartum   Term labor symptoms and general obstetric precautions including but not limited to vaginal bleeding, contractions, leaking of fluid and fetal movement were reviewed in detail with the patient. Please refer to After Visit Summary for other counseling recommendations.  No follow-ups on file.   Donette Larry, CNM

## 2021-04-29 NOTE — Progress Notes (Signed)
First BP 139/81. BP retake 131/83. Pt denies headache or visual changes.

## 2021-05-01 ENCOUNTER — Inpatient Hospital Stay (HOSPITAL_COMMUNITY): Payer: Commercial Managed Care - PPO

## 2021-05-01 ENCOUNTER — Inpatient Hospital Stay (HOSPITAL_COMMUNITY)
Admission: AD | Admit: 2021-05-01 | Discharge: 2021-05-05 | DRG: 807 | Disposition: A | Discharge status: Home / Self Care | Payer: Commercial Managed Care - PPO | Attending: Family Medicine | Admitting: Family Medicine

## 2021-05-01 ENCOUNTER — Other Ambulatory Visit: Payer: Self-pay

## 2021-05-01 ENCOUNTER — Encounter (HOSPITAL_COMMUNITY): Payer: Self-pay | Admitting: Obstetrics and Gynecology

## 2021-05-01 DIAGNOSIS — O26893 Other specified pregnancy related conditions, third trimester: Secondary | ICD-10-CM | POA: Diagnosis present

## 2021-05-01 DIAGNOSIS — O133 Gestational [pregnancy-induced] hypertension without significant proteinuria, third trimester: Secondary | ICD-10-CM

## 2021-05-01 DIAGNOSIS — O99824 Streptococcus B carrier state complicating childbirth: Secondary | ICD-10-CM | POA: Diagnosis present

## 2021-05-01 DIAGNOSIS — Z20822 Contact with and (suspected) exposure to covid-19: Secondary | ICD-10-CM | POA: Diagnosis present

## 2021-05-01 DIAGNOSIS — Z3A40 40 weeks gestation of pregnancy: Secondary | ICD-10-CM | POA: Diagnosis not present

## 2021-05-01 DIAGNOSIS — O134 Gestational [pregnancy-induced] hypertension without significant proteinuria, complicating childbirth: Principal | ICD-10-CM | POA: Diagnosis present

## 2021-05-01 DIAGNOSIS — Z6791 Unspecified blood type, Rh negative: Secondary | ICD-10-CM | POA: Diagnosis not present

## 2021-05-01 DIAGNOSIS — Z880 Allergy status to penicillin: Secondary | ICD-10-CM

## 2021-05-01 DIAGNOSIS — Z3A39 39 weeks gestation of pregnancy: Secondary | ICD-10-CM | POA: Diagnosis not present

## 2021-05-01 DIAGNOSIS — O139 Gestational [pregnancy-induced] hypertension without significant proteinuria, unspecified trimester: Secondary | ICD-10-CM | POA: Diagnosis present

## 2021-05-01 DIAGNOSIS — O4202 Full-term premature rupture of membranes, onset of labor within 24 hours of rupture: Secondary | ICD-10-CM | POA: Diagnosis not present

## 2021-05-01 DIAGNOSIS — O26899 Other specified pregnancy related conditions, unspecified trimester: Secondary | ICD-10-CM

## 2021-05-01 DIAGNOSIS — O99820 Streptococcus B carrier state complicating pregnancy: Secondary | ICD-10-CM

## 2021-05-01 DIAGNOSIS — Z3490 Encounter for supervision of normal pregnancy, unspecified, unspecified trimester: Secondary | ICD-10-CM | POA: Diagnosis present

## 2021-05-01 LAB — COMPREHENSIVE METABOLIC PANEL
ALT: 17 U/L (ref 0–44)
AST: 21 U/L (ref 15–41)
Albumin: 2.8 g/dL — ABNORMAL LOW (ref 3.5–5.0)
Alkaline Phosphatase: 180 U/L — ABNORMAL HIGH (ref 38–126)
Anion gap: 10 (ref 5–15)
BUN: 6 mg/dL (ref 6–20)
CO2: 21 mmol/L — ABNORMAL LOW (ref 22–32)
Calcium: 9.2 mg/dL (ref 8.9–10.3)
Chloride: 105 mmol/L (ref 98–111)
Creatinine, Ser: 0.52 mg/dL (ref 0.44–1.00)
GFR, Estimated: >60 mL/min (ref >60)
Glucose, Bld: 79 mg/dL (ref 70–99)
Potassium: 3.9 mmol/L (ref 3.5–5.1)
Sodium: 136 mmol/L (ref 135–145)
Total Bilirubin: 0.6 mg/dL (ref 0.3–1.2)
Total Protein: 6.3 g/dL — ABNORMAL LOW (ref 6.5–8.1)

## 2021-05-01 LAB — CBC
HCT: 43.4 % (ref 36.0–46.0)
Hemoglobin: 15 g/dL (ref 12.0–15.0)
MCH: 31.2 pg (ref 26.0–34.0)
MCHC: 34.6 g/dL (ref 30.0–36.0)
MCV: 90.2 fL (ref 80.0–100.0)
Platelets: 218 10*3/uL (ref 150–400)
RBC: 4.81 MIL/uL (ref 3.87–5.11)
RDW: 13.5 % (ref 11.5–15.5)
WBC: 12.3 10*3/uL — ABNORMAL HIGH (ref 4.0–10.5)
nRBC: 0 % (ref 0.0–0.2)

## 2021-05-01 LAB — RESP PANEL BY RT-PCR (FLU A&B, COVID) ARPGX2
Influenza A by PCR: NEGATIVE
Influenza B by PCR: NEGATIVE
SARS Coronavirus 2 by RT PCR: NEGATIVE

## 2021-05-01 LAB — PROTEIN / CREATININE RATIO, URINE
Creatinine, Urine: 85.96 mg/dL
Protein Creatinine Ratio: 0.16 mg/mg{Cre} — ABNORMAL HIGH (ref 0.00–0.15)
Total Protein, Urine: 14 mg/dL

## 2021-05-01 LAB — TYPE AND SCREEN
ABO/RH(D): A NEG
Antibody Screen: NEGATIVE

## 2021-05-01 MED ORDER — ONDANSETRON HCL 4 MG/2ML IJ SOLN: 4.0000 mg | Freq: Four times a day (QID) | INTRAMUSCULAR | Status: DC | PRN

## 2021-05-01 MED ORDER — OXYTOCIN BOLUS FROM INFUSION
333.0000 mL | Freq: Once | INTRAVENOUS | Status: AC
  Administered 2021-05-03: 333 mL via INTRAVENOUS

## 2021-05-01 MED ORDER — OXYTOCIN-SODIUM CHLORIDE 30-0.9 UT/500ML-% IV SOLN
2.5000 [IU]/h | INTRAVENOUS | Status: DC
  Filled 2021-05-01 (×2): qty 500

## 2021-05-01 MED ORDER — TERBUTALINE SULFATE 1 MG/ML IJ SOLN: 0.2500 mg | Freq: Once | INTRAMUSCULAR | Status: DC | PRN

## 2021-05-01 MED ORDER — SODIUM CHLORIDE 0.9 % IV SOLN
5.0000 10*6.[IU] | Freq: Once | INTRAVENOUS | Status: AC
  Administered 2021-05-01: 5 10*6.[IU] via INTRAVENOUS
  Filled 2021-05-01: qty 5

## 2021-05-01 MED ORDER — SOD CITRATE-CITRIC ACID 500-334 MG/5ML PO SOLN: 30.0000 mL | ORAL | Status: DC | PRN

## 2021-05-01 MED ORDER — MISOPROSTOL 50MCG HALF TABLET
50.0000 ug | ORAL_TABLET | ORAL | Status: DC | PRN
  Administered 2021-05-01 – 2021-05-02 (×6): 50 ug via ORAL
  Filled 2021-05-01 (×7): qty 1

## 2021-05-01 MED ORDER — ACETAMINOPHEN 325 MG PO TABS
650.0000 mg | ORAL_TABLET | ORAL | Status: DC | PRN
Start: 2021-05-01 — End: 2021-05-03

## 2021-05-01 MED ORDER — HYDROXYZINE HCL 50 MG PO TABS
25.0000 mg | ORAL_TABLET | Freq: Once | ORAL | Status: AC
  Administered 2021-05-01: 25 mg via ORAL
  Filled 2021-05-01: qty 1

## 2021-05-01 MED ORDER — LACTATED RINGERS IV SOLN
500.0000 mL | INTRAVENOUS | Status: DC | PRN
Start: 2021-05-01 — End: 2021-05-03

## 2021-05-01 MED ORDER — OXYCODONE-ACETAMINOPHEN 5-325 MG PO TABS: 1.0000 | ORAL_TABLET | ORAL | Status: DC | PRN

## 2021-05-01 MED ORDER — LIDOCAINE HCL (PF) 1 % IJ SOLN
30.0000 mL | INTRAMUSCULAR | Status: AC | PRN
  Administered 2021-05-03: 30 mL via SUBCUTANEOUS
  Filled 2021-05-01: qty 30

## 2021-05-01 MED ORDER — LACTATED RINGERS IV SOLN
INTRAVENOUS | Status: DC
  Administered 2021-05-01: 125 mL/h via INTRAVENOUS

## 2021-05-01 MED ORDER — OXYCODONE-ACETAMINOPHEN 5-325 MG PO TABS: 2.0000 | ORAL_TABLET | ORAL | Status: DC | PRN

## 2021-05-01 MED ORDER — PENICILLIN G POT IN DEXTROSE 60000 UNIT/ML IV SOLN
3.0000 10*6.[IU] | INTRAVENOUS | Status: DC
  Administered 2021-05-01 – 2021-05-03 (×12): 3 10*6.[IU] via INTRAVENOUS
  Filled 2021-05-01 (×13): qty 50

## 2021-05-01 NOTE — Progress Notes (Signed)
Carrie Hamilton is a 30 y.o. G2P0010 at [redacted]w[redacted]d.  Subjective: Mild cramping  Objective: BP 109/63   Pulse 90   Temp 98.5 F (36.9 C) (Oral)   Resp 16   Ht 5\' 4"  (1.626 m)   Wt 98.4 kg   LMP 07/27/2020   BMI 37.25 kg/m    FHT:  FHR: 125 bpm, variability: mod,  accelerations:  15x15,  decelerations:  none UC:   Q 2-5 minutes, mild Dilation: Fingertip Effacement (%): 50 Station: -2 Presentation: Vertex Exam by:: CNM Roy Tokarz  Foley bulb placed  Labs: Results for orders placed or performed during the hospital encounter of 05/01/21 (from the past 24 hour(s))  CBC     Status: Abnormal   Collection Time: 05/01/21 10:05 AM  Result Value Ref Range   WBC 12.3 (H) 4.0 - 10.5 K/uL   RBC 4.81 3.87 - 5.11 MIL/uL   Hemoglobin 15.0 12.0 - 15.0 g/dL   HCT 05/03/21 69.4 - 85.4 %   MCV 90.2 80.0 - 100.0 fL   MCH 31.2 26.0 - 34.0 pg   MCHC 34.6 30.0 - 36.0 g/dL   RDW 62.7 03.5 - 00.9 %   Platelets 218 150 - 400 K/uL   nRBC 0.0 0.0 - 0.2 %  Type and screen     Status: None   Collection Time: 05/01/21 10:05 AM  Result Value Ref Range   ABO/RH(D) A NEG    Antibody Screen NEG    Sample Expiration      05/04/2021,2359 Performed at [facility], [address], [place], [place]   Resp Panel by RT-PCR (Flu A&B, Covid) Nasopharyngeal Swab     Status: None   Collection Time: 05/01/21 10:05 AM   Specimen: Nasopharyngeal Swab; Nasopharyngeal(NP) swabs in vial transport medium  Result Value Ref Range   SARS Coronavirus 2 by RT PCR NEGATIVE NEGATIVE   Influenza A by PCR NEGATIVE NEGATIVE   Influenza B by PCR NEGATIVE NEGATIVE  Comprehensive metabolic panel     Status: Abnormal   Collection Time: 05/01/21 10:05 AM  Result Value Ref Range   Sodium 136 135 - 145 mmol/L   Potassium 3.9 3.5 - 5.1 mmol/L   Chloride 105 98 - 111 mmol/L   CO2 21 (L) 22 - 32 mmol/L   Glucose, Bld 79 70 - 99 mg/dL   BUN 6 6 - 20 mg/dL   Creatinine, Ser 05/03/21 0.44 - 1.00 mg/dL   Calcium 9.2 8.9 - 7.16  mg/dL   Total Protein 6.3 (L) 6.5 - 8.1 g/dL   Albumin 2.8 (L) 3.5 - 5.0 g/dL   AST 21 15 - 41 U/L   ALT 17 0 - 44 U/L   Alkaline Phosphatase 180 (H) 38 - 126 U/L   Total Bilirubin 0.6 0.3 - 1.2 mg/dL   GFR, Estimated 96.7 >89 mL/min   Anion gap 10 5 - 15  Protein / creatinine ratio, urine     Status: Abnormal   Collection Time: 05/01/21  1:15 PM  Result Value Ref Range   Creatinine, Urine 85.96 mg/dL   Total Protein, Urine 14 mg/dL   Protein Creatinine Ratio 0.16 (H) 0.00 - 0.15 mg/mg[Cre]    Assessment / Plan: [redacted]w[redacted]d week IUP Labor: IOL/Early. Foley now in place. Continue Cytotec.  Fetal Wellbeing:  Category I Pain Control:  Comfort measures Anticipated MOD:  SVD  [redacted]w[redacted]d, CNM 05/01/2021 7:14 PM

## 2021-05-01 NOTE — Progress Notes (Signed)
Labor Progress Note Carrie Hamilton is a 29 y.o. G2P0010 at [redacted]w[redacted]d presented for IOL-gHTN. S: Doing well without complaints, requesting sleep medication.  O:  BP 109/63   Pulse 90   Temp 98.5 F (36.9 C) (Oral)   Resp 16   Ht 5\' 4"  (1.626 m)   Wt 98.4 kg   LMP 07/27/2020   BMI 37.25 kg/m  EFM: baseline 130bpm/mod variability/+ accels/no decels Toco: q1-4 min  CVE: Dilation: Fingertip Effacement (%): 50 Station: -2 Presentation: Vertex Exam by:: CNM Smith   A&P: 30 y.o. G2P0010 [redacted]w[redacted]d presented for IOL-gHTN. #IOL: S/p cyto x2. FB placed @1830 , still firmly in place. Will re-dose cytotec. #Pain: PRN #FWB: cat 1 #GBS positive, PCN, adequate prophylaxis #gHTN: BP initially elevated on admit, wnl since. Asymptomatic. PreE labs normal. #Rh neg: Rhogam eval postpartum  [redacted]w[redacted]d, MD 8:20 PM

## 2021-05-01 NOTE — Progress Notes (Signed)
Carrie Hamilton is a 30 y.o. G2P0010 at [redacted]w[redacted]d.  Subjective: Mild cramping  Objective: BP 109/63   Pulse 90   Temp 97.9 F (36.6 C) (Oral)   Resp 16   Ht 5\' 4"  (1.626 m)   Wt 98.4 kg   LMP 07/27/2020   BMI 37.25 kg/m    FHT:  FHR: 135 bpm, variability: mod,  accelerations:  15x15,  decelerations:  none UC:   Q 1-5 minutes, mild Dilation: Fingertip Effacement (%): 50 Station: Ballotable Presentation: Vertex Exam by:: [email or web address] RN  Labs: Results for orders placed or performed during the hospital encounter of 05/01/21 (from the past 24 hour(s))  CBC     Status: Abnormal   Collection Time: 05/01/21 10:05 AM  Result Value Ref Range   WBC 12.3 (H) 4.0 - 10.5 K/uL   RBC 4.81 3.87 - 5.11 MIL/uL   Hemoglobin 15.0 12.0 - 15.0 g/dL   HCT 05/03/21 63.8 - 75.6 %   MCV 90.2 80.0 - 100.0 fL   MCH 31.2 26.0 - 34.0 pg   MCHC 34.6 30.0 - 36.0 g/dL   RDW 43.3 29.5 - 18.8 %   Platelets 218 150 - 400 K/uL   nRBC 0.0 0.0 - 0.2 %  Type and screen     Status: None   Collection Time: 05/01/21 10:05 AM  Result Value Ref Range   ABO/RH(D) A NEG    Antibody Screen NEG    Sample Expiration      05/04/2021,2359 Performed at [facility], [address] [place], [place]   Resp Panel by RT-PCR (Flu A&B, Covid) Nasopharyngeal Swab     Status: None   Collection Time: 05/01/21 10:05 AM   Specimen: Nasopharyngeal Swab; Nasopharyngeal(NP) swabs in vial transport medium  Result Value Ref Range   SARS Coronavirus 2 by RT PCR NEGATIVE NEGATIVE   Influenza A by PCR NEGATIVE NEGATIVE   Influenza B by PCR NEGATIVE NEGATIVE  Comprehensive metabolic panel     Status: Abnormal   Collection Time: 05/01/21 10:05 AM  Result Value Ref Range   Sodium 136 135 - 145 mmol/L   Potassium 3.9 3.5 - 5.1 mmol/L   Chloride 105 98 - 111 mmol/L   CO2 21 (L) 22 - 32 mmol/L   Glucose, Bld 79 70 - 99 mg/dL   BUN 6 6 - 20 mg/dL   Creatinine, Ser 05/03/21 0.44 - 1.00 mg/dL   Calcium 9.2 8.9 - 1.60 mg/dL    Total Protein 6.3 (L) 6.5 - 8.1 g/dL   Albumin 2.8 (L) 3.5 - 5.0 g/dL   AST 21 15 - 41 U/L   ALT 17 0 - 44 U/L   Alkaline Phosphatase 180 (H) 38 - 126 U/L   Total Bilirubin 0.6 0.3 - 1.2 mg/dL   GFR, Estimated 10.9 >32 mL/min   Anion gap 10 5 - 15  Protein / creatinine ratio, urine     Status: Abnormal   Collection Time: 05/01/21  1:15 PM  Result Value Ref Range   Creatinine, Urine 85.96 mg/dL   Total Protein, Urine 14 mg/dL   Protein Creatinine Ratio 0.16 (H) 0.00 - 0.15 mg/mg[Cre]    Assessment / Plan: [redacted]w[redacted]d week IUP Labor: IOL. Cytotec #2 given. Plan Foley when 1 cm.  Fetal Wellbeing:  Category I Pain Control:  Comfort measures Anticipated MOD:  SVD GHTN:  Pre-E labs Nml. No evidence of Pre-E  [redacted]w[redacted]d Katrinka Blazing, [place] 05/01/2021 5:56 PM

## 2021-05-01 NOTE — H&P (Signed)
HPI: Carrie Hamilton is a 30 y.o. year old G54P0010 female at [redacted]w[redacted]d weeks gestation who presents to L&D for IOL for GHTN.  Denies HA, vision changes or epigastric pain.  Nursing Staff Provider  Office Location  [place] Dating  LMP  Language  English Anatomy US  Normal  Flu Vaccine   Genetic Screen  NIPS: Declined  AFP:   First Screen:  Quad:   TDaP Vaccine    Hgb A1C or  GTT Early  Third trimester  Glucose, Fasting, Gest 65 - 91 mg/dL 78   Glucose, 1 Hr, Gest 65 - 179 mg/dL 601   Glucose, 2 Hr, Gest 65 - 152 mg/dL 093    COVID Vaccine    LAB RESULTS   Rhogam  02/08/21 Blood Type   A Neg  Feeding Plan breast Antibody  Neg  Contraception condoms Rubella  Immune  Circumcision Yes RPR   NR  Pediatrician   HBsAg   Neg  Support Person Tyler HCVAb Neg  Prenatal Classes  HIV     NR  BTL Consent  GBS  (For PCN allergy, check sensitivities)   VBAC Consent  Pap     Hgb Electro  Declined  BP Cuff Pt has cuff CF Declined    SMA Declined    Waterbirth  [ ]  Class [ ]  Consent [ ]  CNM visit    Induction  [ ]  Orders Entered [ ] Foley Y/N    OB History    Gravida  2   Para  0   Term      Preterm      AB  1   Living        SAB  1   IAB      Ectopic      Multiple      Live Births             Past Medical History:  Diagnosis Date  . ADHD    Past Surgical History:  Procedure Laterality Date  . SHOULDER SURGERY     labral repair of right shoulder   Family History: family history includes Heart attack in her father; Hypertension in her mother. Social History:  reports that she has never smoked. She has never used smokeless tobacco. She reports current alcohol use. She reports that she does not use drugs.     Maternal Diabetes: No Genetic Screening: Declined Maternal Ultrasounds/Referrals: Normal Fetal Ultrasounds or other Referrals:  None Maternal Substance Abuse:  No Significant Maternal Medications:  None Significant Maternal Lab Results:  Group B Strep positive and  Rh negative Other Comments:  None  Review of Systems  Constitutional: Negative for chills and fever.  Gastrointestinal: Negative for abdominal pain, nausea and vomiting.  Genitourinary: Negative for vaginal bleeding.  Neurological: Negative for headaches.   Maternal Medical History:  Reason for admission: Nausea. Induction of labor  Contractions: Frequency: rare.   Perceived severity is mild.    Fetal activity: Perceived fetal activity is normal.    Prenatal complications: PIH.   Prenatal Complications - Diabetes: none.    Dilation: Fingertip Effacement (%): 50 Station: Ballotable Exam by:: RN Blood pressure 109/63, pulse 90, temperature 97.9 F (36.6 C), temperature source Oral, resp. rate 16, height 5\' 4"  (1.626 m), weight 98.4 kg, last menstrual period 07/27/2020. Maternal Exam:  Uterine Assessment: Contraction strength is mild.  Contraction frequency is rare.   Abdomen: Patient reports no abdominal tenderness. Estimated fetal weight is 7-12.   Fetal presentation:  vertex  Introitus: Normal vulva. Vulva is negative for lesion.  Normal vagina.  Pelvis: adequate for delivery.   Cervix: Cervix evaluated by digital exam.     Fetal Exam Fetal Monitor Review: Baseline rate: 135.  Variability: moderate (6-25 bpm).   Pattern: accelerations present and no decelerations.    Fetal State Assessment: Category I - tracings are normal.     Physical Exam Constitutional:      General: She is not in acute distress.    Appearance: Normal appearance. She is not ill-appearing or toxic-appearing.  HENT:     Head: Normocephalic.  Eyes:     Conjunctiva/sclera: Conjunctivae normal.  Cardiovascular:     Rate and Rhythm: Normal rate.  Pulmonary:     Effort: Pulmonary effort is normal. No respiratory distress.  Abdominal:     Palpations: Abdomen is soft.     Tenderness: There is no abdominal tenderness.  Genitourinary:    General: Normal vulva.  Vulva is no  lesion.  Musculoskeletal:     Right lower leg: No edema.     Left lower leg: No edema.  Skin:    General: Skin is warm and dry.  Neurological:     General: No focal deficit present.     Mental Status: She is alert and oriented to person, place, and time.  Psychiatric:        Mood and Affect: Mood normal.     Prenatal labs: ABO, Rh: --/--/A NEG (05/15 1005) Antibody: NEG (05/15 1005) Rubella:  Immune RPR: NON-REACTIVE (02/22 0835)  HBsAg:   Neg HIV: NON-REACTIVE (02/22 0835)  GBS: Positive/-- (04/22 0000)   Assessment: 1. Labor: IOL 2. Fetal Wellbeing: Category I  3. Pain Control: Planning epidural 4. GBS: Pos 5. 39.5 week IUP 6. GHTN  Plan:  1. Admit to BS per consult with MD 2. Routine L&D orders 3. Analgesia/anesthesia PRN  4. Cytotec, then Pitocin, AROM PRN 5. Pre-E labs  [place] 05/01/2021, 9:48 AM

## 2021-05-02 LAB — CBC
HCT: 41.9 % (ref 36.0–46.0)
Hemoglobin: 14.6 g/dL (ref 12.0–15.0)
MCH: 31.4 pg (ref 26.0–34.0)
MCHC: 34.8 g/dL (ref 30.0–36.0)
MCV: 90.1 fL (ref 80.0–100.0)
Platelets: 207 10*3/uL (ref 150–400)
RBC: 4.65 MIL/uL (ref 3.87–5.11)
RDW: 13.5 % (ref 11.5–15.5)
WBC: 23.9 10*3/uL — ABNORMAL HIGH (ref 4.0–10.5)
nRBC: 0 % (ref 0.0–0.2)

## 2021-05-02 LAB — RPR: RPR Ser Ql: NONREACTIVE

## 2021-05-02 MED ORDER — MISOPROSTOL 25 MCG QUARTER TABLET
25.0000 ug | ORAL_TABLET | Freq: Once | ORAL | Status: AC
  Administered 2021-05-02: 25 ug via VAGINAL

## 2021-05-02 MED ORDER — TERBUTALINE SULFATE 1 MG/ML IJ SOLN: 0.2500 mg | Freq: Once | INTRAMUSCULAR | Status: DC | PRN

## 2021-05-02 MED ORDER — PHENYLEPHRINE 40 MCG/ML (10ML) SYRINGE FOR IV PUSH (FOR BLOOD PRESSURE SUPPORT): 80.0000 ug | PREFILLED_SYRINGE | INTRAVENOUS | Status: DC | PRN

## 2021-05-02 MED ORDER — EPHEDRINE 5 MG/ML INJ: 10.0000 mg | INTRAVENOUS | Status: DC | PRN

## 2021-05-02 MED ORDER — OXYTOCIN-SODIUM CHLORIDE 30-0.9 UT/500ML-% IV SOLN
1.0000 m[IU]/min | INTRAVENOUS | Status: DC
  Administered 2021-05-02: 2 m[IU]/min via INTRAVENOUS

## 2021-05-02 MED ORDER — DIPHENHYDRAMINE HCL 50 MG/ML IJ SOLN
12.5000 mg | INTRAMUSCULAR | Status: DC | PRN
  Filled 2021-05-02: qty 1

## 2021-05-02 MED ORDER — FENTANYL-BUPIVACAINE-NACL 0.5-0.125-0.9 MG/250ML-% EP SOLN
12.0000 mL/h | EPIDURAL | Status: DC | PRN
  Administered 2021-05-03: 12 mL/h via EPIDURAL
  Filled 2021-05-02 (×2): qty 250

## 2021-05-02 MED ORDER — LACTATED RINGERS IV SOLN
500.0000 mL | Freq: Once | INTRAVENOUS | Status: AC
  Administered 2021-05-03: 500 mL via INTRAVENOUS

## 2021-05-02 NOTE — Progress Notes (Signed)
S: Doing well. She is starting to feel more cramping.   O: Vitals:   05/02/21 1321 05/02/21 1506 05/02/21 1618 05/02/21 1734  BP: 127/78 128/78 120/72 132/88  Pulse: (!) 119 98 (!) 104 (!) 113  Resp: 18 17 17 18   Temp:      TempSrc:      Weight:      Height:         FHT:  FHR: 135 bpm, variability: moderate,  accelerations:  Present,  decelerations:  Absent UC:   irregular, every  minutes SVE:   Dilation: 2 Effacement (%): 60 Station: -3 Exam by:: s Carrie Hamilton snm   A / P: Induction of labor due to gestational hypertension. BPs continue to be labile.  -Continue to monitor closely. Cervix remains unchanged, MD consulted.   -Initiate IV Pitocin 2x2 PRN  Fetal Wellbeing:  Category I Pain Control:  Labor support without medications  Anticipated MOD:  NSVD  Charistopher Rumble [email or web address]) Danella Deis, BSN, RNC-OB  Student Nurse-Midwife   05/02/2021  5:43 PM

## 2021-05-02 NOTE — Progress Notes (Signed)
Labor Progress Note Carrie Hamilton is a 31 y.o. G2P0010 at [redacted]w[redacted]d presented for IOL-gHTN. S: Doing well without complaints.  O:  BP 111/61   Pulse 95   Temp 97.8 F (36.6 C) (Oral)   Resp 18   Ht 5\' 4"  (1.626 m)   Wt 98.4 kg   LMP 07/27/2020   BMI 37.25 kg/m  EFM: baseline 130bpm/mod variability/+ accels/no decels Toco: difficult to trace, lying on side  CVE: Dilation: 3 Effacement (%): 20 Station: -3 Presentation: Vertex Exam by:: Dr. [email or web address]   A&P: 30 y.o. G2P0010 [redacted]w[redacted]d presented for IOL-gHTN. #IOL: S/p cyto x4 and FB. Cytotec re-dosed @0415 , will recheck in 4 hours and re-dose cytotec vs start pitocin pending cervical thickness. #Pain: PRN #FWB: cat 1 #GBS positive, PCN, adequate prophylaxis #gHTN: BP initially elevated on admit, wnl since. Asymptomatic. PreE labs normal. #Rh neg: Rhogam eval postpartum  [redacted]w[redacted]d, MD 5:20 AM

## 2021-05-02 NOTE — Progress Notes (Signed)
Carrie Hamilton is a 30 y.o. G2P0010 at [redacted]w[redacted]d by LMP admitted for induction of labor due to [facility].  Subjective: Patient resting comfortably with spouse, Joselyn Glassman, at bedside.  Objective: BP 110/77   Pulse 99   Temp 98.9 F (37.2 C) (Oral)   Resp 16   Ht 5\' 4"  (1.626 m)   Wt 98.4 kg   LMP 07/27/2020   BMI 37.25 kg/m    FHT:  FHR: 135 bpm, variability: moderate,  accelerations:  Present,  decelerations:  Absent UC:   regular, every 5 minutes SVE:   Dilation: 2.5 Effacement (%): 50 Station: -3 Exam by:: [email or web address], CNM  Possible hand over fetal head  Labs: Lab Results  Component Value Date   WBC 12.3 (H) 05/01/2021   HGB 15.0 05/01/2021   HCT 43.4 05/01/2021   MCV 90.2 05/01/2021   PLT 218 05/01/2021    Assessment / Plan: Induction of labor due to gestational hypertension,  progressing well on pitocin  Labor: Progressing normally Preeclampsia:  n/a Fetal Wellbeing:  Category I Pain Control:  Labor support without medications I/D:  n/a Anticipated MOD:  NSVD  05/03/2021, CNM 05/02/2021, 9:00 AM

## 2021-05-02 NOTE — Progress Notes (Signed)
Labor Progress Note Carrie Hamilton is a 30 y.o. G2P0010 at [redacted]w[redacted]d presented for IOL-gHTN. S: Doing well without complaints.  O:  BP 106/67 (BP Location: Left Arm)   Pulse 93   Temp 99 F (37.2 C) (Oral)   Resp 18   Ht 5\' 4"  (1.626 m)   Wt 98.4 kg   LMP 07/27/2020   BMI 37.25 kg/m  EFM: baseline 140bpm/mod variability/+ accels/no decels Toco: q1-4 min  CVE: Dilation: 3 Effacement (%): 20 Station: -3 Presentation: Vertex Exam by:: Dr. [email or web address]   A&P: 30 y.o. G2P0010 [redacted]w[redacted]d presented for IOL-gHTN. #IOL: S/p cyto x3. FB dislodged with this exam. Given cervical thickeness will re-dose cytotec, 25 mcg vaginally placed. #Pain: PRN #FWB: cat 1 #GBS positive, PCN, adequate prophylaxis #gHTN: BP initially elevated on admit, wnl since. Asymptomatic. PreE labs normal. #Rh neg: Rhogam eval postpartum  [redacted]w[redacted]d, MD 12:32 AM

## 2021-05-02 NOTE — Progress Notes (Signed)
S: Doing well. Experienced some sharp vaginal pain while walking, so currently resting, and has since eased off. Still coping well with induction process.    O: Vitals:   05/02/21 0834 05/02/21 0956 05/02/21 1112 05/02/21 1219  BP: 107/64 110/77 130/78 (!) 120/92  Pulse: (!) 104 99 98 (!) 132  Resp:   18 19  Temp:    97.9 F (36.6 C)  TempSrc:    Oral  Weight:      Height:         FHT:  FHR: 135 bpm, variability: moderate,  accelerations:  Present,  decelerations:  Absent UC:   More regular q2-6 minutes, mild to palpation.  SVE:   Dilation: 2 Effacement (%): 50 Station: -3 Exam by:: Rhunette Croft SNM  Inner cervical os 4cm, soft and stretchy Outer cervical os 2cm, firm   A / P: Induction of labor due to gestational hypertension. S/p FB and cytotec x6.   -Plan for repeat dose of buccal cytotec.  -Recheck in 4 hours.    Preeclampsia: no neural sx  continue to monitor closely for worsening sx  Fetal Wellbeing:  Category I Pain Control:  Labor support without medications I/D: GBS positive, prophylactic PCN management. Adequately treated.  Anticipated MOD:  NSVD  Carrie Hamilton Carrie Hamilton) Carrie Hamilton, BSN, RNC-OB  Student Nurse-Midwife   05/02/2021  1:20 PM

## 2021-05-02 NOTE — Progress Notes (Signed)
S: Doing well. Experiencing some relief since foley balloon came out. She was able to get some rest. Otherwise no complaints.   O: Vitals:   05/02/21 0308 05/02/21 0411 05/02/21 0731 05/02/21 0834  BP: 111/61 98/67 125/80 107/64  Pulse: 95 99 (!) 111 (!) 104  Resp:   16   Temp:   98.9 F (37.2 C)   TempSrc:   Oral   Weight:      Height:         FHT:  FHR: 135 bpm, variability: moderate,  accelerations:  Present,  decelerations:  Absent UC:   regular, every 1-5 minutes SVE:   Dilation: 2.5 Effacement (%): 50 Station: -3 Exam by:: Carrie Hamilton, CNM   A / P: Induction of labor due to gestational hypertension.   -Continue to monitor for worsening Bps/sx of developing PEC.  S/p IP foley balloon, Cervix remains thick.  - Plan for buccal cytotec. Recheck in 4 hours. Evaluate for Pitocin.  I/D: GBS Positive   -Prophylactic PCN, with adequate treatment  Fetal Wellbeing:  Category I Pain Control:  Labor support without medications Anticipated MOD:  NSVD  Carrie Hamilton, BSN, RNC-OB  Student Nurse-Midwife   05/02/2021  9:06 AM

## 2021-05-03 ENCOUNTER — Inpatient Hospital Stay (HOSPITAL_COMMUNITY): Payer: Commercial Managed Care - PPO | Admitting: Anesthesiology

## 2021-05-03 ENCOUNTER — Inpatient Hospital Stay (HOSPITAL_COMMUNITY): Admit: 2021-05-03 | Payer: Self-pay

## 2021-05-03 ENCOUNTER — Encounter (HOSPITAL_COMMUNITY): Payer: Self-pay | Admitting: Obstetrics and Gynecology

## 2021-05-03 DIAGNOSIS — O134 Gestational [pregnancy-induced] hypertension without significant proteinuria, complicating childbirth: Secondary | ICD-10-CM

## 2021-05-03 DIAGNOSIS — Z3A40 40 weeks gestation of pregnancy: Secondary | ICD-10-CM

## 2021-05-03 DIAGNOSIS — O99824 Streptococcus B carrier state complicating childbirth: Secondary | ICD-10-CM

## 2021-05-03 DIAGNOSIS — O4202 Full-term premature rupture of membranes, onset of labor within 24 hours of rupture: Secondary | ICD-10-CM

## 2021-05-03 LAB — CBC WITH DIFFERENTIAL/PLATELET
Abs Immature Granulocytes: 0 10*3/uL (ref 0.00–0.07)
Basophils Absolute: 0 10*3/uL (ref 0.0–0.1)
Basophils Relative: 0 %
Eosinophils Absolute: 0 10*3/uL (ref 0.0–0.5)
Eosinophils Relative: 0 %
HCT: 41 % (ref 36.0–46.0)
Hemoglobin: 14 g/dL (ref 12.0–15.0)
Lymphocytes Relative: 8 %
Lymphs Abs: 2.2 10*3/uL (ref 0.7–4.0)
MCH: 31.4 pg (ref 26.0–34.0)
MCHC: 34.1 g/dL (ref 30.0–36.0)
MCV: 91.9 fL (ref 80.0–100.0)
Monocytes Absolute: 1.1 10*3/uL — ABNORMAL HIGH (ref 0.1–1.0)
Monocytes Relative: 4 %
Neutro Abs: 24.4 10*3/uL — ABNORMAL HIGH (ref 1.7–7.7)
Neutrophils Relative %: 88 %
Platelets: 187 10*3/uL (ref 150–400)
RBC: 4.46 MIL/uL (ref 3.87–5.11)
RDW: 13.8 % (ref 11.5–15.5)
WBC: 27.7 10*3/uL — ABNORMAL HIGH (ref 4.0–10.5)
nRBC: 0 % (ref 0.0–0.2)
nRBC: 0 /100 WBC

## 2021-05-03 MED ORDER — DIPHENHYDRAMINE HCL 50 MG/ML IJ SOLN
25.0000 mg | Freq: Once | INTRAMUSCULAR | Status: AC
  Administered 2021-05-03: 25 mg via INTRAVENOUS

## 2021-05-03 MED ORDER — COCONUT OIL OIL
1.0000 "application " | TOPICAL_OIL | Status: DC | PRN
  Administered 2021-05-04: 1 via TOPICAL

## 2021-05-03 MED ORDER — BENZOCAINE-MENTHOL 20-0.5 % EX AERO
1.0000 "application " | INHALATION_SPRAY | CUTANEOUS | Status: DC | PRN
  Administered 2021-05-04: 1 via TOPICAL
  Filled 2021-05-03: qty 56

## 2021-05-03 MED ORDER — MEASLES, MUMPS & RUBELLA VAC IJ SOLR: 0.5000 mL | Freq: Once | INTRAMUSCULAR | Status: DC

## 2021-05-03 MED ORDER — FLEET ENEMA 7-19 GM/118ML RE ENEM: 1.0000 | ENEMA | Freq: Every day | RECTAL | Status: DC | PRN

## 2021-05-03 MED ORDER — SODIUM CHLORIDE 0.9 % IV SOLN: 250.0000 mL | INTRAVENOUS | Status: DC | PRN

## 2021-05-03 MED ORDER — BISACODYL 10 MG RE SUPP: 10.0000 mg | Freq: Every day | RECTAL | Status: DC | PRN

## 2021-05-03 MED ORDER — ONDANSETRON HCL 4 MG/2ML IJ SOLN: 4.0000 mg | INTRAMUSCULAR | Status: DC | PRN

## 2021-05-03 MED ORDER — SODIUM CHLORIDE 0.9% FLUSH: 3.0000 mL | Freq: Two times a day (BID) | INTRAVENOUS | Status: DC

## 2021-05-03 MED ORDER — LIDOCAINE HCL (PF) 1 % IJ SOLN
INTRAMUSCULAR | Status: DC | PRN
  Administered 2021-05-03: 10 mL via EPIDURAL

## 2021-05-03 MED ORDER — SODIUM CHLORIDE 0.9% FLUSH: 3.0000 mL | INTRAVENOUS | Status: DC | PRN

## 2021-05-03 MED ORDER — ONDANSETRON HCL 4 MG PO TABS: 4.0000 mg | ORAL_TABLET | ORAL | Status: DC | PRN

## 2021-05-03 MED ORDER — WITCH HAZEL-GLYCERIN EX PADS: 1.0000 "application " | MEDICATED_PAD | CUTANEOUS | Status: DC | PRN

## 2021-05-03 MED ORDER — PRENATAL MULTIVITAMIN CH
1.0000 | ORAL_TABLET | Freq: Every day | ORAL | Status: DC
  Administered 2021-05-04 – 2021-05-05 (×2): 1 via ORAL
  Filled 2021-05-03 (×2): qty 1

## 2021-05-03 MED ORDER — BUPIVACAINE HCL (PF) 0.25 % IJ SOLN
INTRAMUSCULAR | Status: DC | PRN
  Administered 2021-05-03: 10 mL via EPIDURAL

## 2021-05-03 MED ORDER — SIMETHICONE 80 MG PO CHEW: 80.0000 mg | CHEWABLE_TABLET | ORAL | Status: DC | PRN

## 2021-05-03 MED ORDER — ACETAMINOPHEN 325 MG PO TABS
650.0000 mg | ORAL_TABLET | ORAL | Status: DC | PRN
  Administered 2021-05-04: 650 mg via ORAL
  Filled 2021-05-03: qty 2

## 2021-05-03 MED ORDER — DIPHENHYDRAMINE HCL 25 MG PO CAPS
25.0000 mg | ORAL_CAPSULE | Freq: Four times a day (QID) | ORAL | Status: DC | PRN
Start: 2021-05-03 — End: 2021-05-05

## 2021-05-03 MED ORDER — IBUPROFEN 600 MG PO TABS
600.0000 mg | ORAL_TABLET | Freq: Four times a day (QID) | ORAL | Status: DC
  Administered 2021-05-04 – 2021-05-05 (×7): 600 mg via ORAL
  Filled 2021-05-03 (×7): qty 1

## 2021-05-03 MED ORDER — SENNOSIDES-DOCUSATE SODIUM 8.6-50 MG PO TABS
2.0000 | ORAL_TABLET | Freq: Every day | ORAL | Status: DC
  Administered 2021-05-04: 2 via ORAL
  Filled 2021-05-03 (×2): qty 2

## 2021-05-03 MED ORDER — ZOLPIDEM TARTRATE 5 MG PO TABS: 5.0000 mg | ORAL_TABLET | Freq: Every evening | ORAL | Status: DC | PRN

## 2021-05-03 MED ORDER — DIBUCAINE (PERIANAL) 1 % EX OINT: 1.0000 "application " | TOPICAL_OINTMENT | CUTANEOUS | Status: DC | PRN

## 2021-05-03 MED ORDER — TETANUS-DIPHTH-ACELL PERTUSSIS 5-2.5-18.5 LF-MCG/0.5 IM SUSY: 0.5000 mL | PREFILLED_SYRINGE | Freq: Once | INTRAMUSCULAR | Status: DC

## 2021-05-03 NOTE — Discharge Summary (Signed)
Postpartum Discharge Summary     Patient Name: Carrie Hamilton DOB: [birth year] MRN: [id number]  Date of admission: 05/01/2021 Delivery date:05/03/2021  Delivering provider: Randa Ngo  Date of discharge: 05/05/2021  Admitting diagnosis: Gestational hypertension [O13.9] Intrauterine pregnancy: [redacted]w[redacted]d    Secondary diagnosis:  Active Problems:   Rh negative status during pregnancy   GBS (group B Streptococcus carrier), +RV culture, currently pregnant   Gestational hypertension   Encounter for planned induction of labor   Vaginal delivery  Additional problems: none    Discharge diagnosis: Term Pregnancy Delivered and Gestational Hypertension                                              Post partum procedures:none Augmentation: Pitocin, Cytotec and IP Foley Complications: None  Hospital course: Induction of Labor With Vaginal Delivery   30y.o. yo G2P0010 at 464w0das admitted to the hospital 05/01/2021 for induction of labor.  Indication for induction: Gestational hypertension.  Patient had an uncomplicated labor course as follows: Membrane Rupture Time/Date: 6:04 PM ,05/02/2021   Delivery Method:Vaginal, Spontaneous  Episiotomy: None  Lacerations:  2nd degree;Labial  Details of delivery can be found in separate delivery note.  Patient had a routine postpartum course. Patient is discharged home 05/05/21.  Newborn Data: Birth date:05/03/2021  Birth time:8:13 PM  Gender:Female  Living status:Living  Apgars:8 ,9  Weight:3759 g   Magnesium Sulfate received: No BMZ received: No Rhophylac:N/A MMR:N/A T-DaP:Given prenatally Flu: No Transfusion:No  Physical exam  Vitals:   05/04/21 0800 05/04/21 1417 05/04/21 2130 05/05/21 0526  BP: 104/72 124/77 (!) 132/95 125/78  Pulse: 85 79 97 78  Resp: '18 17 18 17  ' Temp: 97.9 F (36.6 C) 97.6 F (36.4 C) 97.7 F (36.5 C) 97.6 F (36.4 C)  TempSrc:  Oral Oral Oral  SpO2: 98% 98% 99% 99%  Weight:      Height:       General:  alert, cooperative and no distress Lochia: appropriate Uterine Fundus: firm Incision: N/A DVT Evaluation: No evidence of DVT seen on physical exam. Labs: Lab Results  Component Value Date   WBC 27.7 (H) 05/03/2021   HGB 14.0 05/03/2021   HCT 41.0 05/03/2021   MCV 91.9 05/03/2021   PLT 187 05/03/2021   CMP Latest Ref Rng & Units 05/01/2021  Glucose 70 - 99 mg/dL 79  BUN 6 - 20 mg/dL 6  Creatinine 0.44 - 1.00 mg/dL 0.52  Sodium 135 - 145 mmol/L 136  Potassium 3.5 - 5.1 mmol/L 3.9  Chloride 98 - 111 mmol/L 105  CO2 22 - 32 mmol/L 21(L)  Calcium 8.9 - 10.3 mg/dL 9.2  Total Protein 6.5 - 8.1 g/dL 6.3(L)  Total Bilirubin 0.3 - 1.2 mg/dL 0.6  Alkaline Phos 38 - 126 U/L 180(H)  AST 15 - 41 U/L 21  ALT 0 - 44 U/L 17   Edinburgh Score: Edinburgh Postnatal Depression Scale Screening Tool 05/04/2021  I have been able to laugh and see the funny side of things. 0  I have looked forward with enjoyment to things. 0  I have blamed myself unnecessarily when things went wrong. 1  I have been anxious or worried for no good reason. 0  I have felt scared or panicky for no good reason. 0  Things have been getting on top of me. 0  I  have been so unhappy that I have had difficulty sleeping. 0  I have felt sad or miserable. 0  I have been so unhappy that I have been crying. 0  The thought of harming myself has occurred to me. 0  Edinburgh Postnatal Depression Scale Total 1     After visit meds:  Allergies as of 05/05/2021      Reactions   Sulfa Antibiotics Rash      Medication List    TAKE these medications   acetaminophen 325 MG tablet Commonly known as: Tylenol Take 2 tablets (650 mg total) by mouth every 4 (four) hours as needed (for pain scale < 4).   coconut oil Oil Apply 1 application topically as needed.   Comfort Fit Maternity Supp Med Misc 1 Device by Does not apply route daily.   Fish Oil 500 MG Caps Take by mouth.   ibuprofen 600 MG tablet Commonly known as:  ADVIL Take 1 tablet (600 mg total) by mouth every 6 (six) hours.   Magnesium 250 MG Tabs Take by mouth.   PRENATAL VITAMINS PO Take by mouth.   senna-docusate 8.6-50 MG tablet Commonly known as: Senokot-S Take 2 tablets by mouth at bedtime as needed for mild constipation.   vitamin B-12 100 MCG tablet Commonly known as: CYANOCOBALAMIN Take 1 tablet by mouth daily.   vitamin C 1000 MG tablet Take 1,000 mg by mouth daily.   VITAMIN D-VITAMIN K PO Take by mouth.   ZINC 15 PO Take by mouth.        Discharge home in stable condition Infant Feeding: Breast Infant Disposition:home with mother Discharge instruction: per After Visit Summary and Postpartum booklet. Activity: Advance as tolerated. Pelvic rest for 6 weeks.  Diet: routine diet Future Appointments: Future Appointments  Date Time Provider [place]  05/10/2021 11:00 AM [facility] NURSE [facility]  06/10/2021  8:30 AM Julianne Handler, CNM [facility]   Follow up Visit: Roma Schanz, CNM  P [facility] [place] Support Pool Please schedule this patient for PP visit in: bp check 1wk, pp visit 4-6wks  High risk pregnancy complicated by: GHTN  Delivery mode: SVD  Anticipated Birth Control: Condoms  PP Procedures needed: BP check  Schedule Integrated [place] visit: no  Provider: Any provider    05/05/2021 Janet Berlin, MD

## 2021-05-03 NOTE — Anesthesia Preprocedure Evaluation (Signed)
Anesthesia Evaluation  Patient identified by MRN, date of birth, ID band Patient awake    Reviewed: Allergy & Precautions, H&P , NPO status , Patient's Chart, lab work & pertinent test results  History of Anesthesia Complications Negative for: history of anesthetic complications  Airway Mallampati: II  TM Distance: >3 FB Neck ROM: full    Dental no notable dental hx. (+) Teeth Intact   Pulmonary neg pulmonary ROS,    Pulmonary exam normal breath sounds clear to auscultation       Cardiovascular negative cardio ROS Normal cardiovascular exam Rhythm:regular Rate:Normal     Neuro/Psych negative neurological ROS  negative psych ROS   GI/Hepatic negative GI ROS, Neg liver ROS,   Endo/Other  negative endocrine ROS  Renal/GU negative Renal ROS  negative genitourinary   Musculoskeletal   Abdominal (+) + obese,   Peds  (+) ADHD Hematology negative hematology ROS (+)   Anesthesia Other Findings   Reproductive/Obstetrics (+) Pregnancy                             Anesthesia Physical Anesthesia Plan  ASA: II  Anesthesia Plan: Epidural   Post-op Pain Management:    Induction:   PONV Risk Score and Plan:   Airway Management Planned:   Additional Equipment:   Intra-op Plan:   Post-operative Plan:   Informed Consent: I have reviewed the patients History and Physical, chart, labs and discussed the procedure including the risks, benefits and alternatives for the proposed anesthesia with the patient or authorized representative who has indicated his/her understanding and acceptance.       Plan Discussed with:   Anesthesia Plan Comments:         Anesthesia Quick Evaluation

## 2021-05-03 NOTE — Anesthesia Procedure Notes (Signed)
Epidural Patient location during procedure: OB Start time: 05/03/2021 12:14 AM End time: 05/03/2021 12:24 AM  Staffing Anesthesiologist: Leonides Grills, MD Performed: anesthesiologist   Preanesthetic Checklist Completed: patient identified, IV checked, site marked, risks and benefits discussed, monitors and equipment checked, pre-op evaluation and timeout performed  Epidural Patient position: sitting Prep: DuraPrep Patient monitoring: heart rate, cardiac monitor, continuous pulse ox and blood pressure Approach: midline Location: L3-L4 Injection technique: LOR air  Needle:  Needle type: Tuohy  Needle gauge: 17 G Needle length: 9 cm Needle insertion depth: 6 cm Catheter type: closed end flexible Catheter size: 19 Gauge Catheter at skin depth: 11 cm Test dose: negative and Other  Assessment Events: blood not aspirated, injection not painful, no injection resistance and negative IV test  Additional Notes Informed consent obtained prior to proceeding including risk of failure, 1% risk of PDPH, risk of minor discomfort and bruising.  Discussed alternatives to epidural analgesia and patient desires to proceed.  Timeout performed pre-procedure verifying patient name, procedure, and platelet count.  Patient tolerated procedure well. Reason for block:procedure for pain

## 2021-05-03 NOTE — Progress Notes (Signed)
Labor Progress Note Carrie Hamilton is a 30 y.o. G2P0010 at [redacted]w[redacted]d presented for IOL secondary to gHTN.  S: Pt resting comfortably with epidural in place. Pt's partner and doula at bedside. No concerns at this time.  O:  BP 116/80   Pulse 100   Temp 97.8 F (36.6 C) (Oral)   Resp 16   Ht 5\' 4"  (1.626 m)   Wt 98.4 kg   LMP 07/27/2020   SpO2 100%   BMI 37.25 kg/m  EFM: baseline 135/moderate variability/+accels/no decels Toco: ctx every 2-5 min, 180 MVUs  CVE: Dilation: 8 Effacement (%): 90 Cervical Position: Posterior Station: Plus 1 Presentation: Vertex Exam by:: [email or web address], rn   A&P: 30 y.o. G2P0010 [redacted]w[redacted]d presented for IOL secondary to gHTN. #Labor: Pt with minimal cervical change since 0630. No change in fetal station since 0915. Pitocin continued given 1730 yesterday evening. SROM for clear fluid at 1800 yesterday, now light meconium visible. LOP fetal presentation on bedside ultrasound. Inadequate contractions by IUPC despite pitocin at 26 milli-units/min. Plan for maternal repositioning and recheck of cervical exam in 1 hour. #Pain: epidural in place #FWB: Category 1 strip #GBS positive; adequate with PCN #gHTN: Most recently blood pressure in normal to mild range. No concerning symptoms. Will continue to monitor.  0916, MD 1:10 PM

## 2021-05-03 NOTE — Lactation Note (Signed)
This note was copied from a baby's chart. Lactation Consultation Note  Patient Name: Carrie Hamilton [id number] Date: 05/03/2021 Reason for consult: L&D Initial assessment;Primapara;1st time breastfeeding;Term Age:30 hours  Visited with mom of 1 hour old FT female, she's a P1 and reported (+) breast changes during the pregnancy. LC assisted with latch and hand expression (no colostrum yet) noted that mom's nipples are flat and her tissue doesn't compress very well.   LC took baby STS to mother's left breast in cross cradle position; baby opens his mouth wide and he's willing to latch but due to mom's tissue he's unable to sustain the latch unless LC is compressing and doing teacup hold with the nipple/areola complex to keep him latch. As soon as LC let teacup hold go, baby would slip off the breast and lose the latch. Left mom and baby doing STS after about 8-10 minutes at the breast.  Reviewed normal newborn behavior, cluster feeding, feeding cues, size of baby's stomach and lactogenesis II.   Feeding plan:  1. Encouraged mom to feed baby STS 8-12 times/24 hours or sooner if feeding cues are present 2. Hand expression and breast massage were also encouraged prior latching 3. Mom will need to be set up with a hand pump and breast shells once she gets to her MBU room  No literature provided due to the nature of this L&D consultation. FOB present and supportive. Parents reported all questions and concerns were answered, they're both aware of LC OP services and will call PRN.   Maternal Data Has patient been taught Hand Expression?: Yes Does the patient have breastfeeding experience prior to this delivery?: No  Feeding Mother's Current Feeding Choice: Breast Milk  LATCH Score Latch: Repeated attempts needed to sustain latch, nipple held in mouth throughout feeding, stimulation needed to elicit sucking reflex. (baby keeps slipping off the breast due to flat nipples)  Audible Swallowing:  None  Type of Nipple: Flat (tissue is hard to compress, areolar edema noted)  Comfort (Breast/Nipple): Soft / non-tender  Hold (Positioning): Assistance needed to correctly position infant at breast and maintain latch.  LATCH Score: 5   Lactation Tools Discussed/Used    Interventions Interventions: Breast feeding basics reviewed;Assisted with latch;Skin to skin;Breast massage;Hand express;Breast compression;Support pillows;Adjust position  Discharge Pump: Personal (Spectra DEBP, she brought it to the hospital) [facility] Program: No  Consult Status Consult Status: Follow-up Date: 05/04/21 Follow-up type: In-patient    Koralee Wedeking Venetia Constable 05/03/2021, 9:16 PM

## 2021-05-03 NOTE — Progress Notes (Signed)
Patient Vitals for the past 4 hrs:  BP Pulse SpO2  05/03/21 0041 122/83 (!) 112 100 %  05/03/21 0036 119/80 (!) 107 99 %  05/03/21 0031 126/86 (!) 109 96 %  05/03/21 0028 118/74 (!) 104 96 %  05/03/21 0024 123/78 100 --  05/02/21 2257 (!) 124/93 (!) 110 --   Comfortable w/epidural.  FHR Cat 1. Pt had to move rooms d/t leak in ceiling, so pitocin was briefly discontinued and restarted, now at 2 mu/min.  Ctx q 1-4 minutes.  Will continue to increase prn

## 2021-05-03 NOTE — Progress Notes (Signed)
Patient Vitals for the past 4 hrs:  BP Pulse  05/03/21 0401 (!) 107/59 100  05/03/21 0331 116/61 92  05/03/21 0300 121/67 93   Comfortable w/epidural. Intermittent pressure. Cx 8/90/0.  Pitocin at 14 mu/min. Ctx 2 2-4.  FHR Cat 1. Good progress has been made, continue present mgt.

## 2021-05-04 NOTE — Anesthesia Postprocedure Evaluation (Signed)
Anesthesia Post Note  Patient: Carrie Hamilton  Procedure(s) Performed: AN AD HOC LABOR EPIDURAL     Anesthesia Post Evaluation No complications documented.  Last Vitals:  Vitals:   05/03/21 2352 05/04/21 0338  BP: 128/88 123/81  Pulse: (!) 103 80  Resp: 16 17  Temp: 36.6 C 36.5 C  SpO2: 99% 98%    Last Pain:  Vitals:   05/04/21 0521  TempSrc:   PainSc: Asleep   Pain Goal:                   Cephus Shelling

## 2021-05-04 NOTE — Lactation Note (Signed)
This note was copied from a baby's chart. Lactation Consultation Note  Patient Name: Carrie Hamilton [id number] Date: 05/04/2021 Reason for consult: Follow-up assessment;Mother's request;Difficult latch (Mom has short shafted nipples , with stripes on both breast, mom been given hand pump, pre-pump breast prior to latching infant to help evert nipple shaft for infant have deeper latch.) Age:30 hours P1, term female infant with -4% weight loss. Infant had one void and 5 stools since birth. LC entered room, infant was cuing and  fussy and wanting to BF. Dad assisted mom with hand expression and infant was given 2 mls of colostrum by spoon and appeared calmer. LC discussed with parents on Day 2 infant will start cluster feeding and this is normal infant behavior. Mom did breast stimulation (hand expressed ) small amount of colostrum prior to latching infant on her right breast using the football hold position, infant latched with depth and sustained latch. Infant BF for 10 minutes on the right breast and then switched to the left breast using the football hold position, infant BF for 17 minutes. Per mom, this is first time infant is sustaining latch and she is not having pain with the feeding. LC observed mom has nipple stripes on both of her  breast.  Mom will continue to work on latching and postioning infant at the breast. Mom's plan: 1- Mom will breastfeed infant according to hunger feeding cues, 8 to 12+ or more times within 24 hours, STS. 2- Mom will pre-pump breast with hand pump  or do hand expression prior to latching infant and mom knows to call RN or LC if she needs further assistance with latching infant at the breast. 3- Mom will wear breast shells in bra during the day and not while sleeping nor at night. 4- LC gave mom comfort gels to wear in bra after latching infant and not when using coconut oil.  5- Mom knows she can hand express and give infant extra volume of EBM after latching  infant at the breast. Maternal Data    Feeding Mother's Current Feeding Choice: Breast Milk  LATCH Score Latch: Grasps breast easily, tongue down, lips flanged, rhythmical sucking.  Audible Swallowing: A few with stimulation  Type of Nipple: Everted at rest and after stimulation (short shafted and semi -flat)  Comfort (Breast/Nipple): Filling, red/small blisters or bruises, mild/mod discomfort  Hold (Positioning): Assistance needed to correctly position infant at breast and maintain latch.  LATCH Score: 7   Lactation Tools Discussed/Used Tools: Shells;Pump Breast pump type: Manual Reason for Pumping: Mom will pre-pump brest prior to latch to help evert nipple shaft out more to help infant have deeper latch. Pumping frequency: Prior to latching infant at the breast.  Interventions Interventions: Skin to skin;Assisted with latch;Hand express;Pre-pump if needed;Breast compression;Adjust position;Support pillows;Position options;Expressed milk;Shells;Coconut oil;Comfort gels;Education;Hand pump  Discharge Pump: Manual  Consult Status Consult Status: Follow-up Date: 05/05/21 Follow-up type: In-patient    Danelle Earthly 05/04/2021, 6:26 PM

## 2021-05-04 NOTE — Lactation Note (Signed)
This note was copied from a baby's chart. Lactation Consultation Note Baby 5 hrs old when consult started. Mom was in recliner trying to latch in cradle position. Baby unable to obtain latch. Repositioned in football hold. Using t-cup hold latched baby.  Pre-pumped w/hand pump then finger stimulation to evert nipple for baby to feel in his mouth. Baby did obtain latch. No swallows heard.  Strongly encouraged mom to wear shells in am to evert nipples.  Mom shown how to use DEBP & how to disassemble, clean, & reassemble parts. Mom knows to pump q3h for 15-20 min. Mom tired wanting to sleep. Briefly reviewed how to use pump. #24 flange fits well. Encouraged mom to call for assistance when ready to pump and someone will review again.  Mom encouraged to feed baby 8-12 times/24 hours and with feeding cues.  Newborn feeding habits, STS, I&O, breast massage, supply and demand discussed. Encouraged to call for assistance if needed.  Mom may end up needing NS, but proximity of nipple may make it hard for mom to apply. Lactation brochure given.  Patient Name: Carrie Hamilton [id number] Date: 05/04/2021 Reason for consult: Initial assessment;Primapara;Term Age:29 hours  Maternal Data Has patient been taught Hand Expression?: Yes Does the patient have breastfeeding experience prior to this delivery?: No  Feeding    LATCH Score Latch: Repeated attempts needed to sustain latch, nipple held in mouth throughout feeding, stimulation needed to elicit sucking reflex.  Audible Swallowing: None  Type of Nipple: Flat  Comfort (Breast/Nipple): Soft / non-tender  Hold (Positioning): Assistance needed to correctly position infant at breast and maintain latch.  LATCH Score: 5   Lactation Tools Discussed/Used Tools: Shells;Pump Breast pump type: Double-Electric Breast Pump Pump Education: Setup, frequency, and cleaning;Milk Storage Reason for Pumping: flat  Interventions Interventions: Breast  feeding basics reviewed;Support pillows;Assisted with latch;Position options;Skin to skin;Breast massage;Hand express;Pre-pump if needed;Shells;Reverse pressure;Hand pump;DEBP;Breast compression;Adjust position  Discharge [facility] Program: No  Consult Status Consult Status: Follow-up Date: 05/04/21 Follow-up type: In-patient    Carrie Hamilton, Diamond Nickel 05/04/2021, 2:25 AM

## 2021-05-04 NOTE — Progress Notes (Signed)
Post Partum Day 1 Subjective: Eating, drinking, voiding, ambulating well.  +flatus.  Lochia and pain wnl.  Denies dizziness, lightheadedness, or sob. Some pain in Rt lower leg.   Objective: Blood pressure 123/81, pulse 80, temperature 97.7 F (36.5 C), temperature source Oral, resp. rate 17, height 5\' 4"  (1.626 m), weight 98.4 kg, last menstrual period 07/27/2020, SpO2 98 %, unknown if currently breastfeeding.  Physical Exam:  General: alert, cooperative and no distress Lochia: appropriate Uterine Fundus: firm Incision: n/a DVT Evaluation: No evidence of DVT seen on physical exam. Negative Homan's sign. No cords or calf tenderness. No significant calf/ankle edema.  Recent Labs    05/02/21 2226 05/03/21 2206  HGB 14.6 14.0  HCT 41.9 41.0    Assessment/Plan: Plan for discharge tomorrow, Breastfeeding, Lactation consult and Contraception condoms  Monitor pain in Rt lower leg, no evidence of DVT currently   LOS: 3 days   2207 05/04/2021, 7:44 AM

## 2021-05-04 NOTE — Lactation Note (Signed)
This note was copied from a baby's chart. Lactation Consultation Note Attempted to see mom. Mom sleeping. FOB awake sitting in dark looking at baby.  Patient Name: Carrie Hamilton [id number] Date: 05/04/2021   Age:30 hours  Maternal Data    Feeding    LATCH Score                    Lactation Tools Discussed/Used    Interventions    Discharge    Consult Status      Charyl Dancer 05/04/2021, 1:38 AM

## 2021-05-05 MED ORDER — IBUPROFEN 600 MG PO TABS: 600.0000 mg | ORAL_TABLET | Freq: Four times a day (QID) | ORAL | 0 refills | Status: DC

## 2021-05-05 MED ORDER — SENNOSIDES-DOCUSATE SODIUM 8.6-50 MG PO TABS: 2.0000 | ORAL_TABLET | Freq: Every evening | ORAL | 0 refills | Status: DC | PRN

## 2021-05-05 MED ORDER — COCONUT OIL OIL: 1.0000 "application " | TOPICAL_OIL | 0 refills | Status: DC | PRN

## 2021-05-05 MED ORDER — ACETAMINOPHEN 325 MG PO TABS: 650.0000 mg | ORAL_TABLET | ORAL | 0 refills | Status: DC | PRN

## 2021-05-05 NOTE — Discharge Instructions (Signed)

## 2021-05-05 NOTE — Lactation Note (Signed)
This note was copied from a baby's chart. Lactation Consultation Note  Patient Name: Carrie Hamilton WCHEN'I Date: 05/05/2021 Reason for consult: Follow-up assessment;Primapara;Mother's request Age:30 hours  Mom has compression stripes on nipples and areola bilaterally. The compression stripe on the L nipple is more severe. B/c of that, Mom doesn't want to latch to the L breast at this time, although she is willing to pump.  Latching was tried with the bare breast on the R side with Mom in side-lying position. Using the teacup hold, Mom's nipple inverts. Supplementing (5Fr/syringe) with a nipple shield was attempted, but without success.  Infant does extremely well with the extra-slow flow nipple. Mom pumped 10 mL with her size 21 flanges. She will pump each time infant receives a bottle of DBM.   The hand-out "Formula feeding your baby" was provided to show volume parameters, but parents understand to feed infant until he is content.   Parents' questions answered to their satisfaction at this time.  [name] [facility] 05/05/2021, 3:20 PM

## 2021-05-05 NOTE — Lactation Note (Signed)
This note was copied from a baby's chart. Lactation Consultation Note  Patient Name: Carrie Hamilton [id number] Date: 05/05/2021   Age:30 hours  LC visit attempted; Mom is currently sleeping (I had received report from RN that Mom had been tearful earlier).  I spoke with Dad in the hallway and asked him to push the nurse call button and call for me when Mom is ready for a visit.    [name] [facility] 05/05/2021, 11:09 AM

## 2021-05-05 NOTE — Lactation Note (Signed)
This note was copied from a baby's chart. Lactation Consultation Note  Patient Name: Carrie Hamilton Date: 05/05/2021 Reason for consult: Follow-up assessment;Primapara Age:30 hours  When I entered room, infant was at the breast, but kept coming unlatched. Mom has significant compression stripes on both nipples. I attempted to facilitate latch, but infant was not maintaining latch & was growing increasingly fussy.   I asked parents if they would consent to supplementing at this time. They agreed and chose DBM. Infant was fed DBM with ease, using the extra-slow flow nipple. Paced bottle-feeding was taught to Dad.  Mom had been crying & had not yet eaten lunch. I asked Mom if I could return in a few minutes after she had ordered lunch and she heartily agreed.   I went ahead and brought in size 21 flanges, as those are appropriate for her nipple diameter.  [name] [facility] 05/05/2021, 12:42 PM

## 2021-05-06 ENCOUNTER — Ambulatory Visit: Payer: Self-pay

## 2021-05-06 NOTE — Lactation Note (Signed)
This note was copied from a baby's chart. Lactation Consultation Note  Patient Name: Carrie Hamilton [id number] Date: 05/06/2021 Reason for consult: Follow-up assessment;Primapara;1st time breastfeeding;Term;Nipple pain/trauma;Infant weight loss;Other (Comment) (per mom pumping and bottle feeding at present due to both nipples being sore. per dad baby last fed 40 ml of donor milk. 6 % weight loss , LC reviewed supply and demand / importance of consistent pumping around the clock 8-10 times both breast for 15-19m).   Age:62 hours  Maternal Data Has patient been taught Hand Expression?: Yes  Feeding Mother's Current Feeding Choice: Breast Milk and Donor Milk Nipple Type: Extra Slow Flow  LATCH Score                    Lactation Tools Discussed/Used Tools: Shells;Pump;Flanges;Comfort gels;Coconut oil Flange Size: 21;24 Breast pump type: Double-Electric Breast Pump Pump Education: Milk Storage Pumped volume:  (per mom increasing and then it decreased. LC reassured mom that is normal.)  Interventions Interventions: Breast feeding basics reviewed;Comfort gels;Shells;Coconut oil;DEBP;Education  Discharge Discharge Education: Engorgement and breast care;Warning signs for feeding baby;Outpatient recommendation;Outpatient Epic message sent Pump: Personal;DEBP  Consult Status Consult Status: Complete Date: 05/06/21    Kathrin Greathouse 05/06/2021, 10:31 AM

## 2021-05-10 ENCOUNTER — Ambulatory Visit (INDEPENDENT_AMBULATORY_CARE_PROVIDER_SITE_OTHER): Payer: Commercial Managed Care - PPO | Admitting: *Deleted

## 2021-05-10 ENCOUNTER — Other Ambulatory Visit: Payer: Self-pay

## 2021-05-10 ENCOUNTER — Encounter: Payer: Self-pay | Admitting: *Deleted

## 2021-05-10 VITALS — BP 117/76 | HR 83 | Resp 16 | Ht 64.0 in | Wt 200.0 lb

## 2021-05-10 DIAGNOSIS — R030 Elevated blood-pressure reading, without diagnosis of hypertension: Secondary | ICD-10-CM

## 2021-05-10 NOTE — Progress Notes (Signed)
Pt is here today for a 1 week PP BP check only.  Pt states she is doing well and denies any headaches or complications.  BP today is 117/76.  She will return in 3 weeks for her routine PP visit.

## 2021-05-17 ENCOUNTER — Other Ambulatory Visit: Payer: Self-pay | Admitting: Lactation Services

## 2021-05-17 NOTE — Progress Notes (Unsigned)
All Purpose Nipple Ointment called into [facility] per Dr. Myriam Jacobson.

## 2021-06-02 NOTE — Progress Notes (Signed)
    Post Partum Visit Note  Carrie Hamilton is a 30 y.o. G93P1011 female who presents for a postpartum visit. She is 5 weeks postpartum following a normal spontaneous vaginal delivery.  I have fully reviewed the prenatal and intrapartum course. The delivery was at 40 gestational weeks.  Anesthesia: epidural. Postpartum course has been uncomplicated. Baby is doing well. Baby is feeding by breast. Bleeding staining only. Bowel function is normal. Bladder function is normal. Patient is not sexually active. Contraception method is condoms. Postpartum depression screening: negative.   The pregnancy intention screening data noted above was reviewed. Potential methods of contraception were discussed. The patient elected to proceed with Female Condom.     Health Maintenance Due  Topic Date Due   COVID-19 Vaccine (1) Never done   Hepatitis C Screening  Never done   PAP-Cervical Cytology Screening  10/02/2015   PAP SMEAR-Modifier  10/02/2015    The following portions of the patient's history were reviewed and updated as appropriate: allergies, current medications, past family history, past medical history, past social history, past surgical history, and problem list.  Review of Systems Pertinent items are noted in HPI.  Objective:  BP 106/75   Pulse 70   Resp 16   Ht 5\' 4"  (1.626 m)   Wt 192 lb (87.1 kg)   BMI 32.96 kg/m    General:  alert, cooperative, and no distress   Breasts:  not indicated  Lungs: Normal rate and effort  Heart:  Reg rate  Abdomen: N/a    Wound N/a  GU exam:   Well healed laceration, small amt of suture material present       Assessment:   1. Postpartum exam   2. Pregnancy-induced hypertension, delivered      Normal postpartum exam.  gHTN- resolved  Plan:   Essential components of care per ACOG recommendations:  1.  Mood and well being: Patient with negative depression screening today. Reviewed local resources for support.  - Patient tobacco use? No.   -  hx of drug use? No.    2. Infant care and feeding:  -Patient currently breastmilk feeding? Yes. Discussed returning to work and pumping. Reviewed importance of draining breast regularly to support lactation.  -Social determinants of health (SDOH) reviewed in EPIC. No concerns. The following needs were identified n/a  3. Sexuality, contraception and birth spacing - Patient does not want a pregnancy in the next year.  Desired family size is 2 children.  - Reviewed forms of contraception in tiered fashion. Patient desired condoms today.   - Discussed birth spacing of 18 months  4. Sleep and fatigue -Encouraged family/partner/community support of 4 hrs of uninterrupted sleep to help with mood and fatigue  5. Physical Recovery  - Discussed patients delivery and complications. She describes her labor as good. - Patient had a Vaginal, no problems at delivery. Patient had a 2nd degree laceration. Perineal healing reviewed. Patient expressed understanding - Patient has urinary incontinence? No. - Patient is safe to resume physical and sexual activity  6.  Health Maintenance - HM due items addressed Yes - Last pap smear 09/30/19 NILM -Breast Cancer screening indicated? No.   7. Chronic Disease/Pregnancy Condition follow up: None  - PCP follow up  10/02/19, CNM [facility], [facility]

## 2021-06-10 ENCOUNTER — Ambulatory Visit (INDEPENDENT_AMBULATORY_CARE_PROVIDER_SITE_OTHER): Payer: Commercial Managed Care - PPO | Admitting: Certified Nurse Midwife

## 2021-06-10 ENCOUNTER — Other Ambulatory Visit: Payer: Self-pay

## 2021-06-10 ENCOUNTER — Encounter: Payer: Self-pay | Admitting: Certified Nurse Midwife

## 2021-06-10 DIAGNOSIS — O134 Gestational [pregnancy-induced] hypertension without significant proteinuria, complicating childbirth: Secondary | ICD-10-CM

## 2021-06-10 DIAGNOSIS — O135 Gestational [pregnancy-induced] hypertension without significant proteinuria, complicating the puerperium: Secondary | ICD-10-CM

## 2022-01-21 IMAGING — US US MFM OB COMP +14 WKS
1 series · 13 of 28 positions shown · non-contrast
Comparison: none

[Series 1: us mfm ob comp +14 wks · 13 of 130 slices shown]
[im 5/130]
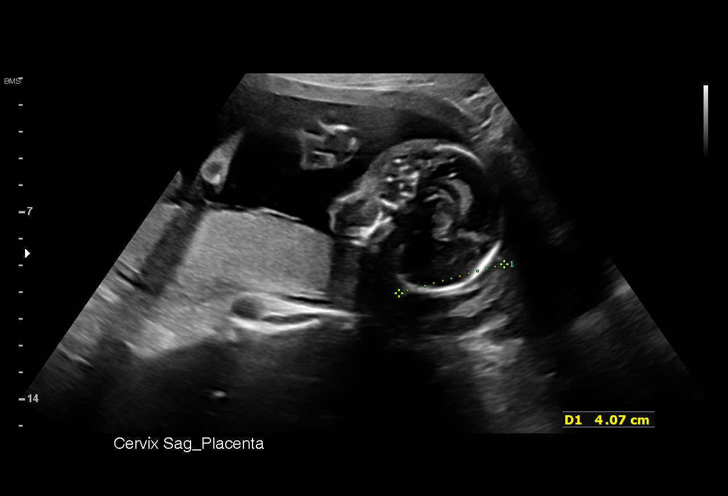
[im 15/130]
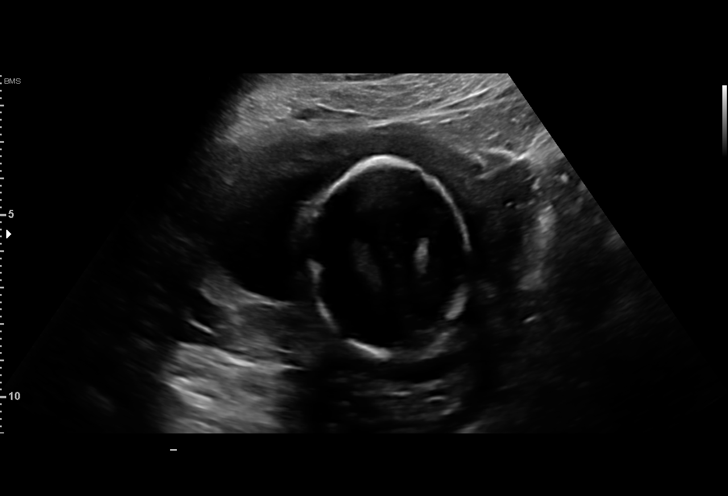
[im 24/130]
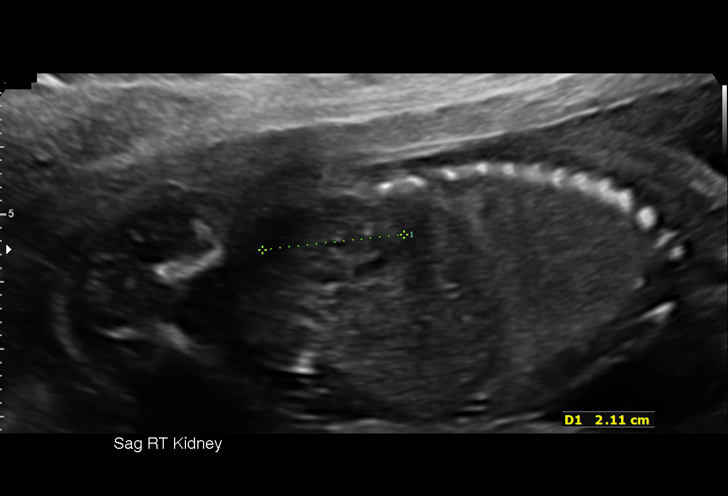
[im 34/130]
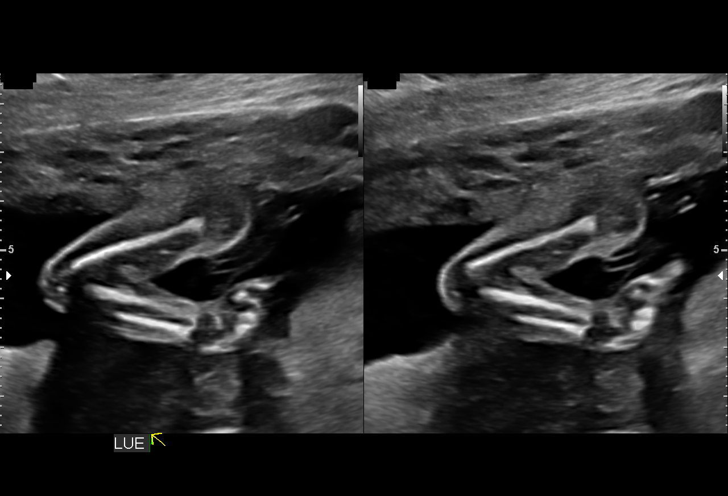
[im 44/130]
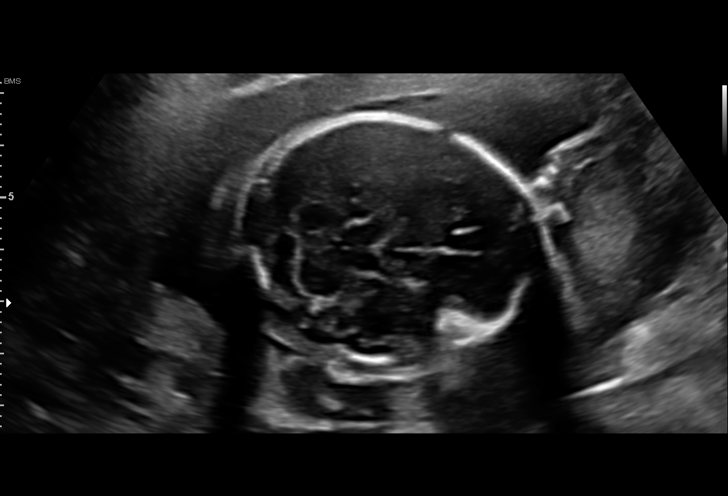
[im 53/130]
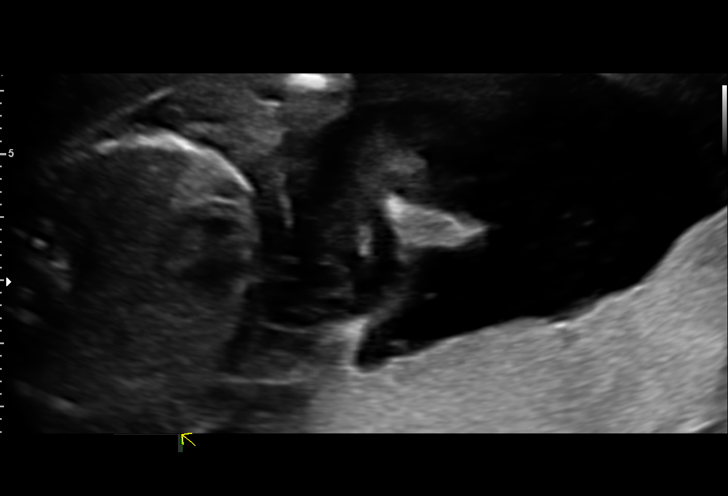
[im 67/130]
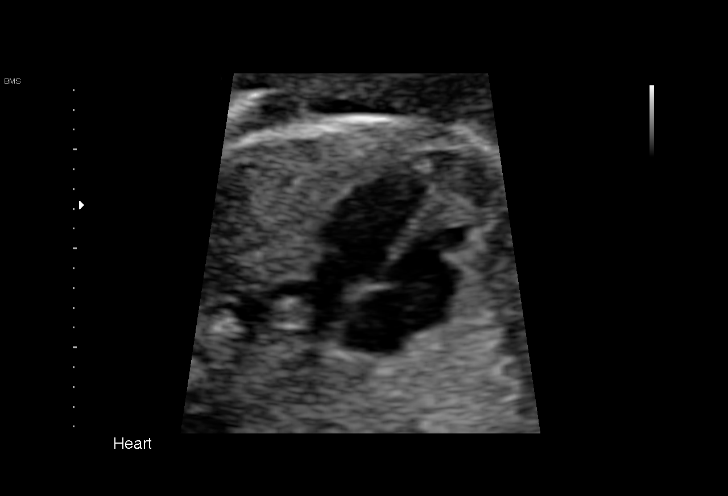
[im 77/130]
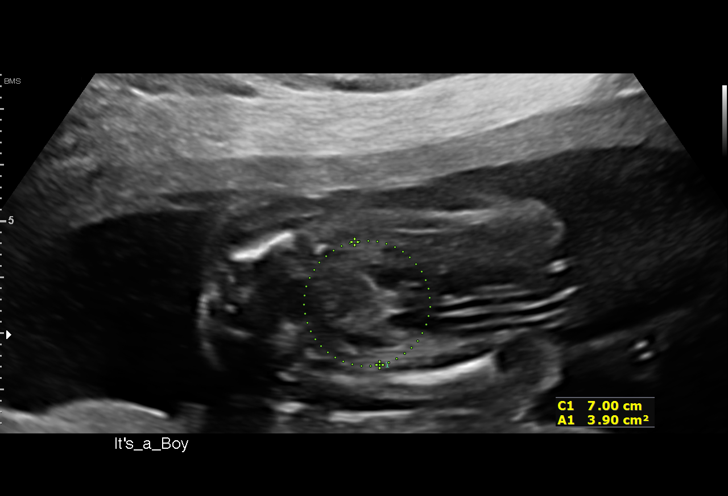
[im 87/130]
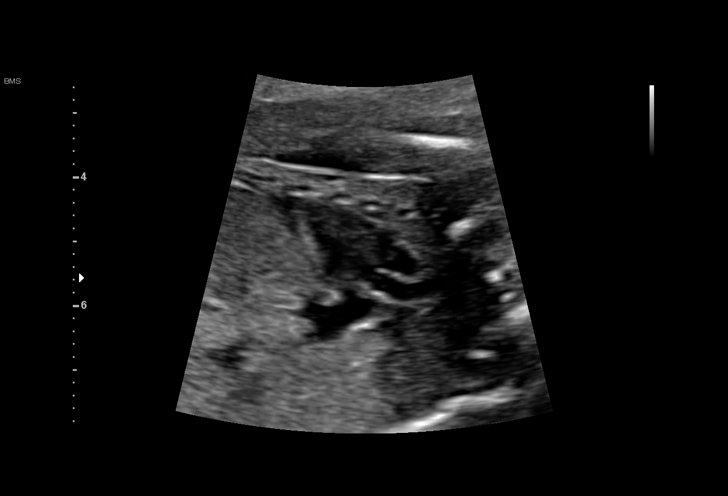
[im 96/130]
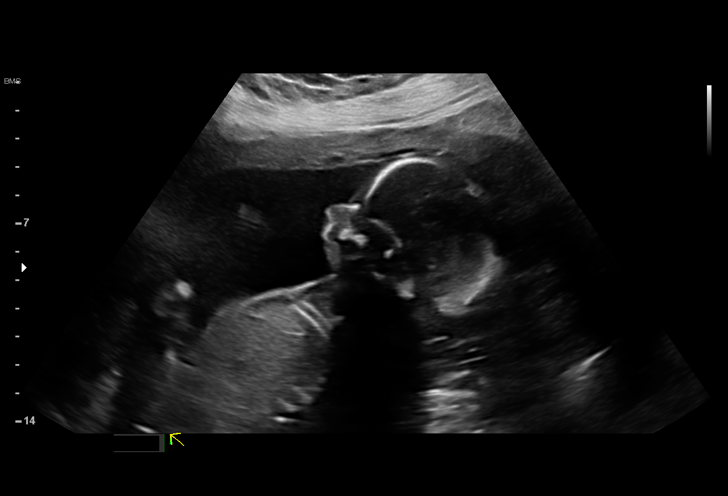
[im 106/130]
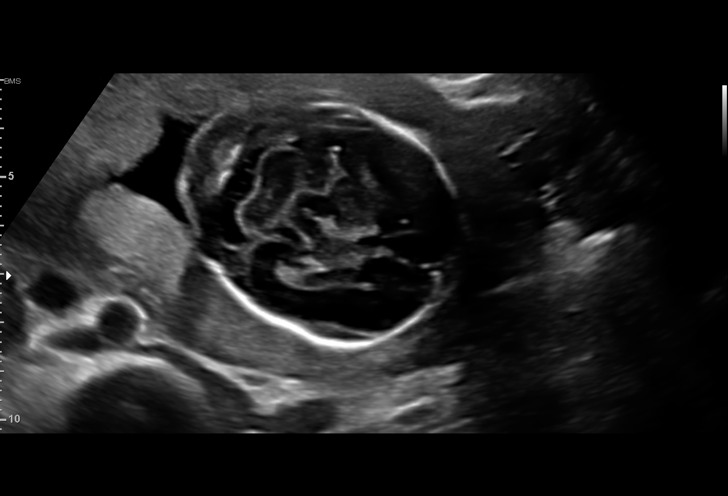
[im 115/130]
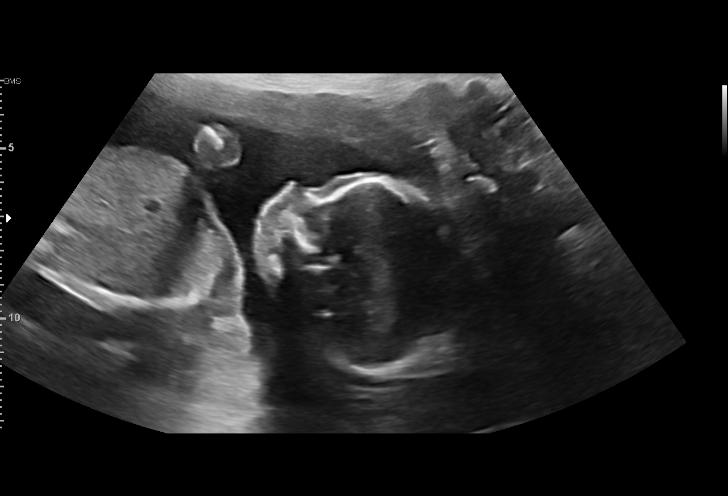
[im 125/130]
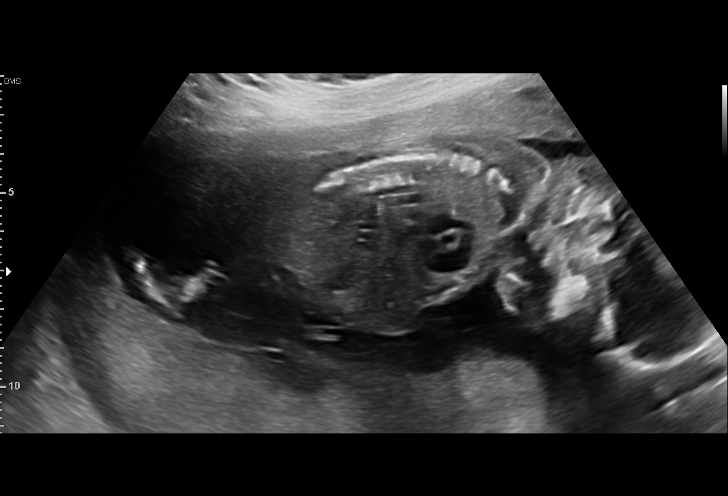

[13 of 28 positions shown; findings below may reference images not displayed]

CNM

 1  US MFM OB COMP + 14 WK                76805.01    VALARIE CORRY

Indications

 Encounter for antenatal screening for
 malformations
 19 weeks gestation of pregnancy
Fetal Evaluation

 Num Of Fetuses:         1
 Fetal Heart Rate(bpm):  152
 Cardiac Activity:       Observed
 Presentation:           Cephalic
 Placenta:               Posterior
 P. Cord Insertion:      Visualized, central

 Amniotic Fluid
 AFI FV:      Within normal limits

                             Largest Pocket(cm)

Biometry

 BPD:      48.5  mm     G. Age:  20w 5d         97  %    CI:        79.35   %    70 - 86
                                                         FL/HC:      17.7   %    16.1 -
 HC:      172.1  mm     G. Age:  19w 5d         79  %    HC/AC:      1.09        1.09 -
 AC:       158   mm     G. Age:  21w 0d         94  %    FL/BPD:     62.7   %
 FL:       30.4  mm     G. Age:  19w 3d         58  %    FL/AC:      19.2   %    20 - 24
 HUM:      29.3  mm     G. Age:  19w 4d         65  %
 CER:      19.7  mm     G. Age:  19w 1d         40  %

 LV:        6.2  mm
 CM:        6.5  mm
 Est. FW:     339  gm    0 lb 12 oz      97  %
OB History

 Gravidity:    2
 Living:       0
Gestational Age

 LMP:           19w 0d        Date:  07/27/20                 EDD:   05/03/21
 U/S Today:     20w 2d                                        EDD:   04/24/21
 Best:          19w 0d     Det. By:  LMP  (07/27/20)          EDD:   05/03/21
Anatomy

 Cranium:               Appears normal         LVOT:                   Appears normal
 Cavum:                 Appears normal         Aortic Arch:            Appears normal
 Ventricles:            Appears normal         Ductal Arch:            Appears normal
 Choroid Plexus:        Appears normal         Diaphragm:              Appears normal
 Cerebellum:            Appears normal         Stomach:                Appears normal, left
                                                                       sided
 Posterior Fossa:       Appears normal         Abdomen:                Appears normal
 Nuchal Fold:           Appears normal         Abdominal Wall:         Appears nml (cord
                                                                       insert, abd wall)
 Face:                  Appears normal         Cord Vessels:           Appears normal (3
                        (orbits and profile)                           vessel cord)
 Lips:                  Appears normal         Kidneys:                Appear normal
 Palate:                Not well visualized    Bladder:                Appears normal
 Thoracic:              Appears normal         Spine:                  Appears normal
 Heart:                 Not well visualized    Upper Extremities:      Appears normal
 RVOT:                  Appears normal         Lower Extremities:      Appears normal

 Other:  Hands and feet visualized. Fetus appears to be a male. Nasal bone
         visualized. Lenses visualized.
Cervix Uterus Adnexa

 Cervix
 Length:           4.09  cm.
 Normal appearance by transabdominal scan.

 Uterus
 Normal shape and size.

 Right Ovary
 Not visualized.

 Left Ovary
 Not visualized.

 Cul De Sac
 No free fluid seen.

 Adnexa
 No adnexal mass visualized.
Comments

 This patient was seen for a detailed fetal anatomy scan.
 She denies any significant past medical history and denies
 any problems in her current pregnancy.
 She has declined all screening tests for fetal aneuploidy in
 her current pregnancy.
 She was informed that the fetal growth and amniotic fluid
 level were appropriate for her gestational age.
 There were no obvious fetal anomalies noted on today's
 ultrasound exam.  However, today's exam was limited due to
 the fetal position.
 The patient was informed that anomalies may be missed due
 to technical limitations. If the fetus is in a suboptimal position
 or maternal habitus is increased, visualization of the fetus in
 the maternal uterus may be impaired.
 A follow-up exam was scheduled in 4 weeks to complete the
 views of the fetal anatomy.

## 2022-07-11 NOTE — Progress Notes (Unsigned)
   ANNUAL EXAM Patient name: Carrie Hamilton MRN [id number]  Date of birth: 03-27-1991 Chief Complaint:   No chief complaint on file.  History of Present Illness:   Carrie Hamilton is a 31 y.o. G54P1011 female being seen today for a routine annual exam.   Current complaints: ***  No LMP recorded.  Current birth control: Condoms  Last pap: 2020. Results were: NILM w/ HRHPV not done. H/O abnormal pap: {yes/yes***/no:23866}   Health Maintenance Due  Topic Date Due   COVID-19 Vaccine (1) Never done   Hepatitis C Screening  Never done   PAP SMEAR-Modifier  10/02/2015    Review of Systems:   Pertinent items are noted in HPI Denies any headaches, blurred vision, fatigue, shortness of breath, chest pain, abdominal pain, abnormal vaginal discharge/itching/odor/irritation, problems with periods, bowel movements, urination, or intercourse unless otherwise stated above. *** Pertinent History Reviewed:  Reviewed past medical,surgical, social and family history.  Reviewed problem list, medications and allergies. Physical Assessment:  There were no vitals filed for this visit.There is no height or weight on file to calculate BMI.   Physical Examination:  General appearance - well appearing, and in no distress Mental status - alert, oriented to person, place, and time Psych:  She has a normal mood and affect Skin - warm and dry, normal color, no suspicious lesions noted Chest - effort normal Heart - normal rate  Breasts - breasts appear normal, no suspicious masses, no skin or nipple changes or axillary nodes Abdomen - soft, nontender, nondistended, no masses or organomegaly Pelvic -  VULVA: normal appearing vulva with no masses, tenderness or lesions  VAGINA: normal appearing vagina with normal color and discharge, no lesions  CERVIX: normal appearing cervix without discharge or lesions, no CMT UTERUS: uterus is felt to be normal size, shape, consistency and nontender  ADNEXA: No adnexal  masses or tenderness noted. Extremities:  No swelling or varicosities noted  Chaperone present for exam  No results found for this or any previous visit (from the past 24 hour(s)).  Assessment & Plan:  Diagnoses and all orders for this visit:  Encounter for annual routine gynecological examination  - Cervical cancer screening: Discussed guidelines. Pap with HPV  - Gardasil: completed - GC/CT: {Blank single:19197::"accepts","declines","not indicated"} - Birth Control: Discussed options and their risks, benefits, and common side effects; discussed VTE with estrogen containing options - desires: {Birth control type:23956} - Breast Health: Encouraged self breast awareness/SBE. Teaching provided.  - F/U 12 months and prn     No orders of the defined types were placed in this encounter.   Meds: No orders of the defined types were placed in this encounter.   Follow-up: No follow-ups on file.  Milas Hock, MD 07/11/2022 12:31 PM

## 2022-07-13 ENCOUNTER — Encounter: Payer: Self-pay | Admitting: Obstetrics and Gynecology

## 2022-07-13 ENCOUNTER — Other Ambulatory Visit (HOSPITAL_COMMUNITY)
Admission: RE | Admit: 2022-07-13 | Discharge: 2022-07-13 | Disposition: A | Discharge status: Home / Self Care | Payer: 59 | Source: Ambulatory Visit | Attending: Obstetrics and Gynecology | Admitting: Obstetrics and Gynecology

## 2022-07-13 ENCOUNTER — Ambulatory Visit (INDEPENDENT_AMBULATORY_CARE_PROVIDER_SITE_OTHER): Payer: 59 | Admitting: Obstetrics and Gynecology

## 2022-07-13 VITALS — BP 130/90 | HR 80 | Resp 16 | Ht 64.0 in | Wt 170.0 lb

## 2022-07-13 DIAGNOSIS — L989 Disorder of the skin and subcutaneous tissue, unspecified: Secondary | ICD-10-CM

## 2022-07-13 DIAGNOSIS — O9229 Other disorders of breast associated with pregnancy and the puerperium: Secondary | ICD-10-CM | POA: Diagnosis not present

## 2022-07-13 DIAGNOSIS — Z01419 Encounter for gynecological examination (general) (routine) without abnormal findings: Secondary | ICD-10-CM | POA: Insufficient documentation

## 2022-07-13 DIAGNOSIS — N898 Other specified noninflammatory disorders of vagina: Secondary | ICD-10-CM

## 2022-07-13 DIAGNOSIS — B3731 Acute candidiasis of vulva and vagina: Secondary | ICD-10-CM

## 2022-07-17 LAB — CYTOLOGY - PAP
Comment: NEGATIVE
Diagnosis: NEGATIVE
High risk HPV: NEGATIVE

## 2022-07-18 MED ORDER — FLUCONAZOLE 150 MG PO TABS: 150.0000 mg | ORAL_TABLET | ORAL | 3 refills | Status: DC

## 2022-07-18 NOTE — Addendum Note (Signed)
Addended by: Milas Hock A on: 07/18/2022 07:29 AM   Modules accepted: Orders

## 2022-12-18 NOTE — L&D Delivery Note (Signed)
OB/GYN Faculty Practice Delivery Note  Carrie Hamilton is a 32 y.o. G3P1011 s/p SVD at [redacted]w[redacted]d. She was admitted for SOL.   ROM: 2h 37m with clear fluid GBS Status:  Negative/-- (10/22 0931) Maximum Maternal Temperature: 98.40F  Labor Progress: Initial SVE: 3.5/50/-2. Epidural. SROM. She then progressed to complete @0051 .   Delivery Date/Time: 10/26/2023 @0106   Delivery: Called to room and patient was complete and pushing. Head delivered ROA. No nuchal cord present. Shoulder and body delivered in usual fashion. Infant with spontaneous cry, placed on mother's abdomen, dried and stimulated. Cord clamped x 2 after 1-minute delay, and cut by FOB. Cord blood drawn. Placenta delivered spontaneously with gentle cord traction. Fundus firm with massage and Pitocin. Labia, perineum, vagina, and cervix inspected without laceration.   Baby Weight: 3760g  Placenta: 3 vessel, intact. Sent to L&D Complications: None Lacerations: Hemostatic abrasion of perineum EBL: 112 mL Analgesia: Epidural   Infant:  APGAR (1 MIN): 9  APGAR (5 MINS): 9   Wyn Forster, MD OB Family Medicine Fellow, [facility] for [facility], [facility] 10/26/2023, 1:24 AM

## 2023-04-09 ENCOUNTER — Encounter: Payer: Self-pay | Admitting: *Deleted

## 2023-04-09 DIAGNOSIS — Z3490 Encounter for supervision of normal pregnancy, unspecified, unspecified trimester: Secondary | ICD-10-CM | POA: Insufficient documentation

## 2023-04-09 HISTORY — DX: Encounter for supervision of normal pregnancy, unspecified, unspecified trimester: Z34.90

## 2023-04-13 ENCOUNTER — Encounter: Payer: Self-pay | Admitting: Obstetrics and Gynecology

## 2023-04-13 ENCOUNTER — Ambulatory Visit (INDEPENDENT_AMBULATORY_CARE_PROVIDER_SITE_OTHER): Payer: BC Managed Care – PPO | Admitting: Obstetrics and Gynecology

## 2023-04-13 ENCOUNTER — Ambulatory Visit (INDEPENDENT_AMBULATORY_CARE_PROVIDER_SITE_OTHER): Payer: BC Managed Care – PPO

## 2023-04-13 VITALS — BP 127/79 | HR 83 | Wt 180.0 lb

## 2023-04-13 DIAGNOSIS — E669 Obesity, unspecified: Secondary | ICD-10-CM | POA: Diagnosis not present

## 2023-04-13 DIAGNOSIS — Z3490 Encounter for supervision of normal pregnancy, unspecified, unspecified trimester: Secondary | ICD-10-CM

## 2023-04-13 DIAGNOSIS — Z8759 Personal history of other complications of pregnancy, childbirth and the puerperium: Secondary | ICD-10-CM | POA: Insufficient documentation

## 2023-04-13 DIAGNOSIS — Z3A10 10 weeks gestation of pregnancy: Secondary | ICD-10-CM

## 2023-04-13 DIAGNOSIS — Z3481 Encounter for supervision of other normal pregnancy, first trimester: Secondary | ICD-10-CM

## 2023-04-13 DIAGNOSIS — F909 Attention-deficit hyperactivity disorder, unspecified type: Secondary | ICD-10-CM

## 2023-04-13 HISTORY — DX: Attention-deficit hyperactivity disorder, unspecified type: F90.9

## 2023-04-13 MED ORDER — DOXYLAMINE-PYRIDOXINE 10-10 MG PO TBEC: 1.0000 | DELAYED_RELEASE_TABLET | Freq: Every day | ORAL | 0 refills | Status: DC

## 2023-04-13 MED ORDER — SCOPOLAMINE 1 MG/3DAYS TD PT72: 1.0000 | MEDICATED_PATCH | TRANSDERMAL | 12 refills | Status: DC

## 2023-04-13 NOTE — Progress Notes (Signed)
History:   Jaimy Kliethermes is a 32 y.o. G3P1011 at [redacted]w[redacted]d by LMP being seen today for her first obstetrical visit.  Her obstetrical history is significant for obesity and pregnancy induced hypertension. Patient does intend to breast feed. Pregnancy history fully reviewed.  Patient reports nausea. Improving some.   HISTORY: OB History  Gravida Para Term Preterm AB Living  3 1 1  0 1 1  SAB IAB Ectopic Multiple Live Births  1 0 0 0 1    # Outcome Date GA Lbr Len/2nd Weight Sex Delivery Anes PTL Lv  3 Current           2 Term 05/03/21 [redacted]w[redacted]d / 01:02 8 lb 4.6 oz (3.759 kg) M Vag-Spont EPI  LIV     Birth Comments: WNL     Name: NAKAIYA, BEDDOW     Apgar1: 8  Apgar5: 9  1 SAB             Last pap smear was done 06/2022 and was normal  Past Medical History:  Diagnosis Date   ADHD    Past Surgical History:  Procedure Laterality Date   SHOULDER SURGERY     labral repair of right shoulder   Family History  Problem Relation Age of Onset   Hypertension Mother    Heart attack Father    Sudden death Neg Hx    Hyperlipidemia Neg Hx    Diabetes Neg Hx    Social History   Tobacco Use   Smoking status: Never   Smokeless tobacco: Never  Vaping Use   Vaping Use: Never used  Substance Use Topics   Alcohol use: Yes    Comment: occasional   Drug use: No   Allergies  Allergen Reactions   Sulfa Antibiotics Rash   Current Outpatient Medications on File Prior to Visit  Medication Sig Dispense Refill   Prenatal Vit-Fe Fumarate-FA (PRENATAL VITAMINS PO) Take by mouth.     No current facility-administered medications on file prior to visit.    Review of Systems Pertinent items noted in HPI and remainder of comprehensive ROS otherwise negative.  Indications for ASA therapy (per UpToDate) One of the following: Previous pregnancy with preeclampsia, especially early onset and with an adverse outcome No Multifetal gestation No Chronic hypertension No Type 1 or 2 diabetes mellitus  No Chronic kidney disease No Autoimmune disease (antiphospholipid syndrome, systemic lupus erythematosus) No Two or more of the following: Nulliparity No Obesity (body mass index >30 kg/m2) Yes Family history of preeclampsia in mother or sister No Age ?35 years No Sociodemographic characteristics (African American race, low socioeconomic level) No Personal risk factors (eg, previous pregnancy with low birth weight or small for gestational age infant, previous adverse pregnancy outcome [eg, stillbirth], interval >10 years between pregnancies) No    Physical Exam:   Vitals:   04/13/23 0812  BP: 127/79  Pulse: 83  Weight: 180 lb (81.6 kg)   Fetal Heart Rate (bpm): 165  Uterine size:    Patient informed that the ultrasound is considered a limited obstetric ultrasound and is not intended to be a complete ultrasound exam.  Patient also informed that the ultrasound is not being completed with the intent of assessing for fetal or placental anomalies or any pelvic abnormalities.  Explained that the purpose of today's ultrasound is to assess for fetal heart rate.  Patient acknowledges the purpose of the exam and the limitations of the study. General: well-developed, well-nourished female in no acute distress  Breasts:  normal appearance, no masses or tenderness bilaterally, exam done in the presence of a chaperone.   Skin: normal coloration and turgor, no rashes  Neurologic: oriented, normal, negative, normal mood  Extremities: normal strength, tone, and muscle mass, ROM of all joints is normal  HEENT PERRLA, extraocular movement intact and sclera clear, anicteric  Neck supple and no masses  Cardiovascular: regular rate and rhythm  Respiratory:  no respiratory distress, normal breath sounds  Abdomen: soft, non-tender; bowel sounds normal; no masses,  no organomegaly  Pelvic: Deferred     Assessment:    Pregnancy: [id number] Patient Active Problem List   Diagnosis Date Noted   History of  gestational hypertension 04/13/2023   Attention deficit hyperactivity disorder (ADHD) 04/13/2023   Supervision of normal pregnancy 04/09/2023     Plan:   1. Encounter for supervision of normal pregnancy, antepartum, unspecified gravidity  - US OB Limited; Future  2. History of gestational hypertension  Diagnosed and induced for GHTN at the end of previous pregnancy.  Baseline labs today CBC, CMP, PCR Discussed starting ASA 81 mg daily, patient with some hesitation and would like to research this on her own. Will continue to discuss at [facility] visits.  A1c    Initial labs drawn. Continue prenatal vitamins. Problem list reviewed and updated. Genetic Screening discussed, Panorama and Horizon: undecided. Ultrasound discussed; fetal anatomic survey: scheduled. Anticipatory guidance about prenatal visits given including labs, ultrasounds, and testing. Weight gain recommendations per IOM guidelines reviewed: underweight/BMI 18.5 or less > 28 - 40 lbs; normal weight/BMI 18.5 - 24.9 > 25 - 35 lbs; overweight/BMI 25 - 29.9 > 15 - 25 lbs; obese/BMI  30 or more > 11 - 20 lbs. Discussed usage of the Babyscripts app for more information about pregnancy, and to track blood pressures. Also discussed usage of virtual visits as additional source of managing and completing prenatal visits.  Patient was encouraged to use MyChart to review results, send requests, and have questions addressed.   The nature of Center for [facility] Healthcare/Faculty Practice with multiple MDs and Advanced Practice Providers was explained to patient; also emphasized that residents, students are part of our team. Routine obstetric precautions reviewed. Encouraged to seek out care at our office or emergency room [facility] preferred) for urgent and/or emergent concerns. No follow-ups on file.     Cashtyn Pouliot, Harolyn Rutherford, NP Faculty Practice Center for [employer], [facility]

## 2023-04-17 LAB — PREGNANCY, INITIAL SCREEN
Antibody Screen: NEGATIVE
Basophils Absolute: 0 10*3/uL (ref 0.0–0.2)
Basos: 1 %
Bilirubin, UA: NEGATIVE
Chlamydia trachomatis, NAA: NEGATIVE
EOS (ABSOLUTE): 0 10*3/uL (ref 0.0–0.4)
Eos: 0 %
Glucose, UA: NEGATIVE
HCV Ab: NONREACTIVE
HIV Screen 4th Generation wRfx: NONREACTIVE
Hematocrit: 46.7 % — ABNORMAL HIGH (ref 34.0–46.6)
Hemoglobin: 15.1 g/dL (ref 11.1–15.9)
Hepatitis B Surface Ag: NEGATIVE
Immature Grans (Abs): 0 10*3/uL (ref 0.0–0.1)
Immature Granulocytes: 0 %
Ketones, UA: NEGATIVE
Leukocytes,UA: NEGATIVE
Lymphocytes Absolute: 2.8 10*3/uL (ref 0.7–3.1)
Lymphs: 32 %
MCH: 28.7 pg (ref 26.6–33.0)
MCHC: 32.3 g/dL (ref 31.5–35.7)
MCV: 89 fL (ref 79–97)
Monocytes Absolute: 0.5 10*3/uL (ref 0.1–0.9)
Monocytes: 5 %
Neisseria Gonorrhoeae by PCR: NEGATIVE
Neutrophils Absolute: 5.4 10*3/uL (ref 1.4–7.0)
Neutrophils: 62 %
Nitrite, UA: NEGATIVE
Platelets: 304 10*3/uL (ref 150–450)
Protein,UA: NEGATIVE
RBC, UA: NEGATIVE
RBC: 5.26 x10E6/uL (ref 3.77–5.28)
RDW: 13 % (ref 11.7–15.4)
RPR Ser Ql: NONREACTIVE
Rh Factor: NEGATIVE
Rubella Antibodies, IGG: 1.8 index (ref >0.99)
Specific Gravity, UA: 1.015 (ref 1.005–1.030)
Urobilinogen, Ur: 0.2 mg/dL (ref 0.2–1.0)
WBC: 8.8 10*3/uL (ref 3.4–10.8)
pH, UA: 8.5 — ABNORMAL HIGH (ref 5.0–7.5)

## 2023-04-17 LAB — MICROSCOPIC EXAMINATION
Bacteria, UA: NONE SEEN
Casts: NONE SEEN /lpf
RBC, Urine: NONE SEEN /hpf (ref 0–2)
WBC, UA: NONE SEEN /hpf (ref 0–5)

## 2023-04-17 LAB — COMPREHENSIVE METABOLIC PANEL
ALT: 18 IU/L (ref 0–32)
AST: 17 IU/L (ref 0–40)
Albumin/Globulin Ratio: 1.5 (ref 1.2–2.2)
Albumin: 4.7 g/dL (ref 3.9–4.9)
Alkaline Phosphatase: 50 IU/L (ref 44–121)
BUN/Creatinine Ratio: 11 (ref 9–23)
BUN: 7 mg/dL (ref 6–20)
Bilirubin Total: 0.3 mg/dL (ref 0.0–1.2)
CO2: 19 mmol/L — ABNORMAL LOW (ref 20–29)
Calcium: 9.9 mg/dL (ref 8.7–10.2)
Chloride: 101 mmol/L (ref 96–106)
Creatinine, Ser: 0.66 mg/dL (ref 0.57–1.00)
Globulin, Total: 3.1 g/dL (ref 1.5–4.5)
Glucose: 86 mg/dL (ref 70–99)
Potassium: 4.2 mmol/L (ref 3.5–5.2)
Sodium: 139 mmol/L (ref 134–144)
Total Protein: 7.8 g/dL (ref 6.0–8.5)
eGFR: 120 mL/min/{1.73_m2} (ref >59)

## 2023-04-17 LAB — URINE CULTURE, OB REFLEX: Organism ID, Bacteria: NO GROWTH

## 2023-04-17 LAB — PROTEIN / CREATININE RATIO, URINE
Creatinine, Urine: 69.4 mg/dL
Protein, Ur: 4.7 mg/dL
Protein/Creat Ratio: 68 mg/g creat (ref 0–200)

## 2023-04-17 LAB — HCV INTERPRETATION

## 2023-04-28 LAB — HORIZON CUSTOM: REPORT SUMMARY: NEGATIVE

## 2023-04-28 LAB — PANORAMA PRENATAL TEST FULL PANEL:PANORAMA TEST PLUS 5 ADDITIONAL MICRODELETIONS: FETAL FRACTION: 6

## 2023-05-10 ENCOUNTER — Ambulatory Visit (INDEPENDENT_AMBULATORY_CARE_PROVIDER_SITE_OTHER): Payer: BC Managed Care – PPO | Admitting: Family Medicine

## 2023-05-10 VITALS — BP 118/86 | HR 93 | Wt 183.0 lb

## 2023-05-10 DIAGNOSIS — Z6791 Unspecified blood type, Rh negative: Secondary | ICD-10-CM

## 2023-05-10 DIAGNOSIS — O26899 Other specified pregnancy related conditions, unspecified trimester: Secondary | ICD-10-CM

## 2023-05-10 DIAGNOSIS — Z3490 Encounter for supervision of normal pregnancy, unspecified, unspecified trimester: Secondary | ICD-10-CM

## 2023-05-10 DIAGNOSIS — Z8759 Personal history of other complications of pregnancy, childbirth and the puerperium: Secondary | ICD-10-CM

## 2023-05-10 NOTE — Progress Notes (Signed)
   PRENATAL VISIT NOTE  Subjective:  Carrie Hamilton is a 32 y.o. G3P1011 at [redacted]w[redacted]d being seen today for ongoing prenatal care.  She is currently monitored for the following issues for this low-risk pregnancy and has Rh negative state in antepartum period; Supervision of normal pregnancy; History of gestational hypertension; Attention deficit hyperactivity disorder (ADHD); and Elevated blood pressure reading in office with white coat syndrome, without diagnosis of hypertension on their problem list.  Patient reports no complaints.  Contractions: Not present. Vag. Bleeding: None.  Movement: Absent. Denies leaking of fluid.   The following portions of the patient's history were reviewed and updated as appropriate: allergies, current medications, past family history, past medical history, past social history, past surgical history and problem list.   Objective:   Vitals:   05/10/23 1113  BP: 118/86  Pulse: 93  Weight: 183 lb (83 kg)    Fetal Status: Fetal Heart Rate (bpm): 154   Movement: Absent     General:  Alert, oriented and cooperative. Patient is in no acute distress.  Skin: Skin is warm and dry. No rash noted.   Cardiovascular: Normal heart rate noted  Respiratory: Normal respiratory effort, no problems with respiration noted  Abdomen: Soft, gravid, appropriate for gestational age.  Pain/Pressure: Absent     Pelvic: Cervical exam deferred        Extremities: Normal range of motion.  Edema: None  Mental Status: Normal mood and affect. Normal behavior. Normal judgment and thought content.   Assessment and Plan:  Pregnancy: G3P1011 at [redacted]w[redacted]d 1. Encounter for supervision of normal pregnancy, antepartum, unspecified gravidity Continue routine prenatal care.  2. History of gestational hypertension  Borderline BP--would like to not take ASA, she reports White coat HTN only, has cuff and will monitor at home  3. Rh negative state in antepartum period Will need Rhogam @ 28 weeks and pp  if indicated  General obstetric precautions including but not limited to vaginal bleeding, contractions, leaking of fluid and fetal movement were reviewed in detail with the patient. Please refer to After Visit Summary for other counseling recommendations.   Return in 4 weeks (on 06/07/2023).  Future Appointments  Date Time Provider Department Center  06/05/2023  3:50 PM Rober Minion [facility]  06/11/2023  9:30 AM [facility] NURSE [facility]  06/11/2023  9:45 AM [facility] US5 [facility]    Reva Bores, MD

## 2023-06-05 ENCOUNTER — Ambulatory Visit (INDEPENDENT_AMBULATORY_CARE_PROVIDER_SITE_OTHER): Payer: BC Managed Care – PPO | Admitting: Certified Nurse Midwife

## 2023-06-05 ENCOUNTER — Encounter: Payer: Self-pay | Admitting: Certified Nurse Midwife

## 2023-06-05 VITALS — BP 136/83 | HR 102 | Wt 190.0 lb

## 2023-06-05 DIAGNOSIS — Z3A18 18 weeks gestation of pregnancy: Secondary | ICD-10-CM

## 2023-06-05 DIAGNOSIS — Z3482 Encounter for supervision of other normal pregnancy, second trimester: Secondary | ICD-10-CM

## 2023-06-05 NOTE — Progress Notes (Signed)
Subjective:  Carrie Hamilton is a 32 y.o. G3P1011 at [redacted]w[redacted]d being seen today for ongoing prenatal care.  She is currently monitored for the following issues for this low-risk pregnancy and has Rh negative state in antepartum period; Supervision of normal pregnancy; History of gestational hypertension; Attention deficit hyperactivity disorder (ADHD); and Elevated blood pressure reading in office with white coat syndrome, without diagnosis of hypertension on their problem list.  Patient reports no complaints.  Contractions: Not present. Vag. Bleeding: None.  Movement: Present. Denies leaking of fluid.   The following portions of the patient's history were reviewed and updated as appropriate: allergies, current medications, past family history, past medical history, past social history, past surgical history and problem list. Problem list updated.  Objective:   Vitals:   06/05/23 1543  BP: 136/83  Pulse: (!) 102  Weight: 190 lb (86.2 kg)    Fetal Status: Fetal Heart Rate (bpm): 156 Fundal Height: 18 cm Movement: Present     General:  Alert, oriented and cooperative. Patient is in no acute distress.  Skin: Skin is warm and dry. No rash noted.   Cardiovascular: Normal heart rate noted  Respiratory: Normal respiratory effort, no problems with respiration noted  Abdomen: Soft, gravid, appropriate for gestational age. Pain/Pressure: Absent     Pelvic: Vag. Bleeding: None Vag D/C Character: Thin   Cervical exam deferred        Extremities: Normal range of motion.  Edema: None  Mental Status: Normal mood and affect. Normal behavior. Normal judgment and thought content.   Urinalysis:      Assessment and Plan:  Pregnancy: G3P1011 at [redacted]w[redacted]d  1. Encounter for supervision of other normal pregnancy in second trimester  2. [redacted] weeks gestation of pregnancy  Preterm labor symptoms and general obstetric precautions including but not limited to vaginal bleeding, contractions, leaking of fluid and fetal  movement were reviewed in detail with the patient. Please refer to After Visit Summary for other counseling recommendations.  Return in about 6 weeks (around 07/17/2023).   Donette Larry, CNM

## 2023-06-06 DIAGNOSIS — O99210 Obesity complicating pregnancy, unspecified trimester: Secondary | ICD-10-CM | POA: Insufficient documentation

## 2023-06-11 ENCOUNTER — Other Ambulatory Visit: Payer: Self-pay | Admitting: *Deleted

## 2023-06-11 ENCOUNTER — Ambulatory Visit: Payer: BC Managed Care – PPO | Admitting: *Deleted

## 2023-06-11 ENCOUNTER — Ambulatory Visit: Payer: BC Managed Care – PPO | Attending: Obstetrics and Gynecology

## 2023-06-11 ENCOUNTER — Encounter: Payer: Self-pay | Admitting: *Deleted

## 2023-06-11 VITALS — BP 123/73 | HR 101

## 2023-06-11 DIAGNOSIS — O09892 Supervision of other high risk pregnancies, second trimester: Secondary | ICD-10-CM | POA: Diagnosis not present

## 2023-06-11 DIAGNOSIS — O99212 Obesity complicating pregnancy, second trimester: Secondary | ICD-10-CM

## 2023-06-11 DIAGNOSIS — Z8759 Personal history of other complications of pregnancy, childbirth and the puerperium: Secondary | ICD-10-CM

## 2023-06-11 DIAGNOSIS — Z3490 Encounter for supervision of normal pregnancy, unspecified, unspecified trimester: Secondary | ICD-10-CM | POA: Diagnosis present

## 2023-06-11 DIAGNOSIS — Z3A14 14 weeks gestation of pregnancy: Secondary | ICD-10-CM | POA: Diagnosis not present

## 2023-06-11 DIAGNOSIS — Z6791 Unspecified blood type, Rh negative: Secondary | ICD-10-CM

## 2023-06-11 DIAGNOSIS — Z363 Encounter for antenatal screening for malformations: Secondary | ICD-10-CM | POA: Insufficient documentation

## 2023-06-11 DIAGNOSIS — Z362 Encounter for other antenatal screening follow-up: Secondary | ICD-10-CM

## 2023-07-17 ENCOUNTER — Ambulatory Visit (INDEPENDENT_AMBULATORY_CARE_PROVIDER_SITE_OTHER): Payer: BC Managed Care – PPO | Admitting: Student

## 2023-07-17 VITALS — BP 114/76 | HR 98 | Wt 196.0 lb

## 2023-07-17 DIAGNOSIS — Z3482 Encounter for supervision of other normal pregnancy, second trimester: Secondary | ICD-10-CM

## 2023-07-17 DIAGNOSIS — Z3A24 24 weeks gestation of pregnancy: Secondary | ICD-10-CM

## 2023-07-17 DIAGNOSIS — Z8759 Personal history of other complications of pregnancy, childbirth and the puerperium: Secondary | ICD-10-CM

## 2023-07-17 DIAGNOSIS — E669 Obesity, unspecified: Secondary | ICD-10-CM

## 2023-07-17 NOTE — Progress Notes (Signed)
   PRENATAL VISIT NOTE  Subjective:  Carrie Hamilton is a 32 y.o. G3P1011 at 107w3d being seen today for ongoing prenatal care.  She is currently monitored for the following issues for this low-risk pregnancy and has Rh negative state in antepartum period; Supervision of normal pregnancy; History of gestational hypertension; Attention deficit hyperactivity disorder (ADHD); Elevated blood pressure reading in office with white coat syndrome, without diagnosis of hypertension; and Obesity affecting pregnancy on their problem list.  Patient reports no complaints.  Contractions: Not present. Vag. Bleeding: None.  Movement: Present. Denies leaking of fluid.   The following portions of the patient's history were reviewed and updated as appropriate: allergies, current medications, past family history, past medical history, past social history, past surgical history and problem list.   Objective:   Vitals:   07/17/23 0908  BP: 114/76  Pulse: 98  Weight: 196 lb (88.9 kg)    Fetal Status: Fetal Heart Rate (bpm): 148   Movement: Present     General:  Alert, oriented and cooperative. Patient is in no acute distress.  Skin: Skin is warm and dry. No rash noted.   Cardiovascular: Normal heart rate noted  Respiratory: Normal respiratory effort, no problems with respiration noted  Abdomen: Soft, gravid, appropriate for gestational age.  Pain/Pressure: Absent     Pelvic: Cervical exam deferred        Extremities: Normal range of motion.  Edema: None  Mental Status: Normal mood and affect. Normal behavior. Normal judgment and thought content.   Assessment and Plan:  Pregnancy: G3P1011 at [redacted]w[redacted]d 1. Encounter for supervision of other normal pregnancy in second trimester - frequent fetal movement - Desires Linden Dolin, information provided via handout  2. [redacted] weeks gestation of pregnancy - third trimester labs at next visit  3. History of gestational hypertension - blood pressures have been stable this  pregnancy  4. Obesity (BMI 30-39.9) - has follow-up growth scan scheduled   Preterm labor symptoms and general obstetric precautions including but not limited to vaginal bleeding, contractions, leaking of fluid and fetal movement were reviewed in detail with the patient. Please refer to After Visit Summary for other counseling recommendations.   Return in about 4 weeks (around 08/14/2023) for LOB/GTT, IN-PERSON.  Future Appointments  Date Time Provider Department Center  08/14/2023  8:50 AM Rasch, Harolyn Rutherford, NP [facility]  09/03/2023  9:30 AM [facility] US3 [facility]    Corlis Hove, NP

## 2023-08-07 ENCOUNTER — Encounter: Payer: Self-pay | Admitting: *Deleted

## 2023-08-14 ENCOUNTER — Ambulatory Visit: Payer: BC Managed Care – PPO | Admitting: Obstetrics and Gynecology

## 2023-08-14 VITALS — BP 119/79 | HR 114 | Wt 200.0 lb

## 2023-08-14 DIAGNOSIS — Z3482 Encounter for supervision of other normal pregnancy, second trimester: Secondary | ICD-10-CM

## 2023-08-14 NOTE — Progress Notes (Signed)
Pt declined  Tdap

## 2023-08-14 NOTE — Patient Instructions (Signed)
Safe Medications in Pregnancy   Acne:  Benzoyl Peroxide  Salicylic Acid   Backache/Headache:  Tylenol: 2 regular strength every 4 hours OR               2 Extra strength every 6 hours   Colds/Coughs/Allergies:  Benadryl (alcohol free) 25 mg every 6 hours as needed  Breath right strips  Claritin  Cepacol throat lozenges  Chloraseptic throat spray  Cold-Eeze- up to three times per day  Cough drops, alcohol free  Flonase (by prescription only)  Guaifenesin  Mucinex  Robitussin DM (plain only, alcohol free)  Saline nasal spray/drops  Sudafed (pseudoephedrine) & Actifed * use only after [redacted] weeks gestation and if you do not have high blood pressure  Tylenol  Vicks Vaporub  Zinc lozenges  Zyrtec   Constipation:  Colace  Ducolax suppositories  Fleet enema  Glycerin suppositories  Metamucil  Milk of magnesia  Miralax  Senokot  Smooth move tea   Diarrhea:  Kaopectate  Imodium A-D   *NO pepto Bismol   Hemorrhoids:  Anusol  Anusol HC  Preparation H  Tucks   Indigestion:  Tums  Maalox  Mylanta  Pepcid   Insomnia:  Benadryl (alcohol free) 25mg  every 6 hours as needed  Tylenol PM  Unisom, no Gelcaps   Leg Cramps:  Tums  MagGel   Nausea/Vomiting:  Bonine  Dramamine  Emetrol  Ginger extract  Sea bands  Meclizine  Nausea medication to take during pregnancy:  Unisom (doxylamine succinate 25 mg tablets) Take one tablet daily at bedtime. If symptoms are not adequately controlled, the dose can be increased to a maximum recommended dose of two tablets daily (1/2 tablet in the morning, 1/2 tablet mid-afternoon and one at bedtime).  Vitamin B6 100mg  tablets. Take one tablet twice a day (up to 200 mg per day).   Skin Rashes:  Aveeno products  Benadryl cream or 25mg  every 6 hours as needed  Calamine Lotion  1% cortisone cream   Yeast infection:  Gyne-lotrimin 7  Monistat 7    **If taking multiple medications, please check labels to avoid duplicating the  same active ingredients  **take medication as directed on the label  ** Do not exceed 4000 mg of tylenol in 24 hours  **Do not take medications that contain aspirin or ibuprofen

## 2023-08-14 NOTE — Progress Notes (Signed)
   PRENATAL VISIT NOTE  Subjective:  Carrie Hamilton is a 32 y.o. G3P1011 at [redacted]w[redacted]d being seen today for ongoing prenatal care.  She is currently monitored for the following issues for this low-risk pregnancy and has Rh negative state in antepartum period; Supervision of normal pregnancy; History of gestational hypertension; Attention deficit hyperactivity disorder (ADHD); Elevated blood pressure reading in office with white coat syndrome, without diagnosis of hypertension; and Obesity affecting pregnancy on their problem list.  Patient reports no complaints.  Contractions: Not present. Vag. Bleeding: None.  Movement: Present. Denies leaking of fluid.   The following portions of the patient's history were reviewed and updated as appropriate: allergies, current medications, past family history, past medical history, past social history, past surgical history and problem list.   Objective:   Vitals:   08/14/23 0849  BP: 119/79  Pulse: (!) 114  Weight: 200 lb (90.7 kg)    Fetal Status: Fetal Heart Rate (bpm): 152 Fundal Height: 29 cm Movement: Present     General:  Alert, oriented and cooperative. Patient is in no acute distress.  Skin: Skin is warm and dry. No rash noted.   Cardiovascular: Normal heart rate noted  Respiratory: Normal respiratory effort, no problems with respiration noted  Abdomen: Soft, gravid, appropriate for gestational age.  Pain/Pressure: Absent     Pelvic: Cervical exam deferred        Extremities: Normal range of motion.  Edema: Trace  Mental Status: Normal mood and affect. Normal behavior. Normal judgment and thought content.   Assessment and Plan:  Pregnancy: G3P1011 at [redacted]w[redacted]d 1. Encounter for supervision of other normal pregnancy in second trimester  - Glucose Tolerance, 2 Hours w/1 Hour - HIV antibody (with reflex) - CBC - RPR - Antibody screen - Declined TDAP and BASA.   Preterm labor symptoms and general obstetric precautions including but not limited  to vaginal bleeding, contractions, leaking of fluid and fetal movement were reviewed in detail with the patient. Please refer to After Visit Summary for other counseling recommendations.   No follow-ups on file.  Future Appointments  Date Time Provider Department Center  08/28/2023  1:30 PM Larinda Herter, Harolyn Rutherford, NP [facility]  09/03/2023  9:30 AM [facility] US3 [facility]    Venia Carbon, NP

## 2023-08-15 LAB — CBC
Hematocrit: 40.4 % (ref 34.0–46.6)
Hemoglobin: 13.3 g/dL (ref 11.1–15.9)
MCH: 30.4 pg (ref 26.6–33.0)
MCHC: 32.9 g/dL (ref 31.5–35.7)
MCV: 92 fL (ref 79–97)
Platelets: 189 10*3/uL (ref 150–450)
RBC: 4.38 x10E6/uL (ref 3.77–5.28)
RDW: 13.1 % (ref 11.7–15.4)
WBC: 11.8 10*3/uL — ABNORMAL HIGH (ref 3.4–10.8)

## 2023-08-15 LAB — HIV ANTIBODY (ROUTINE TESTING W REFLEX): HIV Screen 4th Generation wRfx: NONREACTIVE

## 2023-08-15 LAB — ANTIBODY SCREEN: Antibody Screen: NEGATIVE

## 2023-08-15 LAB — GLUCOSE TOLERANCE, 2 HOURS W/ 1HR
Glucose, 1 hour: 121 mg/dL (ref 70–179)
Glucose, 2 hour: 115 mg/dL (ref 70–152)
Glucose, Fasting: 77 mg/dL (ref 70–91)

## 2023-08-15 LAB — RPR: RPR Ser Ql: NONREACTIVE

## 2023-08-28 ENCOUNTER — Ambulatory Visit (INDEPENDENT_AMBULATORY_CARE_PROVIDER_SITE_OTHER): Payer: BC Managed Care – PPO | Admitting: Obstetrics and Gynecology

## 2023-08-28 VITALS — BP 127/89 | HR 108 | Wt 200.0 lb

## 2023-08-28 DIAGNOSIS — Z3482 Encounter for supervision of other normal pregnancy, second trimester: Secondary | ICD-10-CM

## 2023-08-28 NOTE — Progress Notes (Signed)
   PRENATAL VISIT NOTE  Subjective:  Carrie Hamilton is a 32 y.o. G3P1011 at [redacted]w[redacted]d being seen today for ongoing prenatal care.  She is currently monitored for the following issues for this low-risk pregnancy and has Rh negative state in antepartum period; Supervision of normal pregnancy; History of gestational hypertension; Attention deficit hyperactivity disorder (ADHD); Elevated blood pressure reading in office with white coat syndrome, without diagnosis of hypertension; and Obesity affecting pregnancy on their problem list.  Patient reports no complaints.  Contractions: Not present. Vag. Bleeding: None.  Movement: Present. Denies leaking of fluid.   The following portions of the patient's history were reviewed and updated as appropriate: allergies, current medications, past family history, past medical history, past social history, past surgical history and problem list.   Objective:   Vitals:   08/28/23 1331  BP: 127/89  Pulse: (!) 108  Weight: 200 lb (90.7 kg)    Fetal Status: Fetal Heart Rate (bpm): 141 Fundal Height: 31 cm Movement: Present     General:  Alert, oriented and cooperative. Patient is in no acute distress.  Skin: Skin is warm and dry. No rash noted.   Cardiovascular: Normal heart rate noted  Respiratory: Normal respiratory effort, no problems with respiration noted  Abdomen: Soft, gravid, appropriate for gestational age.  Pain/Pressure: Absent     Pelvic: Cervical exam deferred        Extremities: Normal range of motion.  Edema: None  Mental Status: Normal mood and affect. Normal behavior. Normal judgment and thought content.   Assessment and Plan:  Pregnancy: G3P1011 at [redacted]w[redacted]d   1. Encounter for supervision of other normal pregnancy in second trimester      Doing well Passed 2 hour GTT Declines BASA BP borderline today. Hx of GHTN in previous pregnancy. Reviewed signs/symptoms and risks.  MFM growth [place] next week If GHTN is diagnosed; start antenatal  testing.   Preterm labor symptoms and general obstetric precautions including but not limited to vaginal bleeding, contractions, leaking of fluid and fetal movement were reviewed in detail with the patient. Please refer to After Visit Summary for other counseling recommendations.   No follow-ups on file.  Future Appointments  Date Time Provider Department Center  09/03/2023  9:30 AM [facility] US3 [facility]  09/17/2023  9:30 AM Penne Lash, Fredrich Romans, MD [facility]    Venia Carbon, NP

## 2023-08-30 ENCOUNTER — Encounter: Payer: Self-pay | Admitting: *Deleted

## 2023-09-03 ENCOUNTER — Ambulatory Visit: Payer: BC Managed Care – PPO | Attending: Obstetrics and Gynecology

## 2023-09-03 DIAGNOSIS — O99213 Obesity complicating pregnancy, third trimester: Secondary | ICD-10-CM

## 2023-09-03 DIAGNOSIS — Z362 Encounter for other antenatal screening follow-up: Secondary | ICD-10-CM | POA: Insufficient documentation

## 2023-09-03 DIAGNOSIS — E669 Obesity, unspecified: Secondary | ICD-10-CM | POA: Diagnosis not present

## 2023-09-03 DIAGNOSIS — O360130 Maternal care for anti-D [Rh] antibodies, third trimester, not applicable or unspecified: Secondary | ICD-10-CM

## 2023-09-03 DIAGNOSIS — O26892 Other specified pregnancy related conditions, second trimester: Secondary | ICD-10-CM | POA: Diagnosis present

## 2023-09-03 DIAGNOSIS — Z6791 Unspecified blood type, Rh negative: Secondary | ICD-10-CM | POA: Insufficient documentation

## 2023-09-03 DIAGNOSIS — O09293 Supervision of pregnancy with other poor reproductive or obstetric history, third trimester: Secondary | ICD-10-CM | POA: Diagnosis not present

## 2023-09-03 DIAGNOSIS — O99212 Obesity complicating pregnancy, second trimester: Secondary | ICD-10-CM | POA: Diagnosis present

## 2023-09-03 DIAGNOSIS — Z8759 Personal history of other complications of pregnancy, childbirth and the puerperium: Secondary | ICD-10-CM | POA: Insufficient documentation

## 2023-09-03 DIAGNOSIS — Z3A31 31 weeks gestation of pregnancy: Secondary | ICD-10-CM

## 2023-09-17 ENCOUNTER — Ambulatory Visit (INDEPENDENT_AMBULATORY_CARE_PROVIDER_SITE_OTHER): Payer: BC Managed Care – PPO | Admitting: Obstetrics & Gynecology

## 2023-09-17 VITALS — BP 112/77 | HR 108 | Wt 208.0 lb

## 2023-09-17 DIAGNOSIS — Z3482 Encounter for supervision of other normal pregnancy, second trimester: Secondary | ICD-10-CM

## 2023-09-17 DIAGNOSIS — O26899 Other specified pregnancy related conditions, unspecified trimester: Secondary | ICD-10-CM

## 2023-09-17 DIAGNOSIS — Z6791 Unspecified blood type, Rh negative: Secondary | ICD-10-CM

## 2023-09-17 NOTE — Progress Notes (Signed)
Pt declined flu shot    PRENATAL VISIT NOTE  Subjective:  Carrie Hamilton is a 32 y.o. G3P1011 at [redacted]w[redacted]d being seen today for ongoing prenatal care.  She is currently monitored for the following issues for this low-risk pregnancy and has Rh negative state in antepartum period; Supervision of normal pregnancy; History of gestational hypertension; Elevated blood pressure reading in office with white coat syndrome, without diagnosis of hypertension; and Obesity affecting pregnancy on their problem list.  Patient reports no complaints.  Contractions: Not present. Vag. Bleeding: None.  Movement: Present. Denies leaking of fluid.   The following portions of the patient's history were reviewed and updated as appropriate: allergies, current medications, past family history, past medical history, past social history, past surgical history and problem list.   Objective:   Vitals:   09/17/23 0836  BP: 112/77  Pulse: (!) 108  Weight: 208 lb (94.3 kg)    Fetal Status: Fetal Heart Rate (bpm): 141   Movement: Present     General:  Alert, oriented and cooperative. Patient is in no acute distress.  Skin: Skin is warm and dry. No rash noted.   Cardiovascular: Normal heart rate noted  Respiratory: Normal respiratory effort, no problems with respiration noted  Abdomen: Soft, gravid, appropriate for gestational age.  Pain/Pressure: Present     Pelvic: Cervical exam deferred        Extremities: Normal range of motion.  Edema: Trace  Mental Status: Normal mood and affect. Normal behavior. Normal judgment and thought content.   Assessment and Plan:  Pregnancy: G3P1011 at [redacted]w[redacted]d 1. Encounter for supervision of other normal pregnancy in second trimester Declined flu shot Fundal height 33 Baby Rx BPs normal   2. Rh negative state in antepartum period Declined rho gam  Preterm labor symptoms and general obstetric precautions including but not limited to vaginal bleeding, contractions, leaking of fluid and  fetal movement were reviewed in detail with the patient. Please refer to After Visit Summary for other counseling recommendations.   Return in about 3 weeks (around 10/08/2023).  Future Appointments  Date Time Provider Department Center  09/25/2023 10:10 AM Rasch, Harolyn Rutherford, NP [facility]  10/09/2023  9:30 AM Rasch, Harolyn Rutherford, NP [facility]  10/16/2023  9:30 AM Rasch, Harolyn Rutherford, NP [facility]    Elsie Lincoln, MD

## 2023-09-25 ENCOUNTER — Encounter: Payer: BC Managed Care – PPO | Admitting: Obstetrics and Gynecology

## 2023-10-09 ENCOUNTER — Ambulatory Visit (INDEPENDENT_AMBULATORY_CARE_PROVIDER_SITE_OTHER): Payer: BC Managed Care – PPO | Admitting: Obstetrics and Gynecology

## 2023-10-09 ENCOUNTER — Other Ambulatory Visit (HOSPITAL_COMMUNITY)
Admission: RE | Admit: 2023-10-09 | Discharge: 2023-10-09 | Disposition: A | Discharge status: Home / Self Care | Payer: BC Managed Care – PPO | Source: Ambulatory Visit | Attending: Obstetrics and Gynecology | Admitting: Obstetrics and Gynecology

## 2023-10-09 VITALS — BP 125/88 | HR 128 | Wt 215.0 lb

## 2023-10-09 DIAGNOSIS — Z3482 Encounter for supervision of other normal pregnancy, second trimester: Secondary | ICD-10-CM

## 2023-10-09 NOTE — Progress Notes (Signed)
   PRENATAL VISIT NOTE  Subjective:  Carrie Hamilton is a 32 y.o. G3P1011 at [redacted]w[redacted]d being seen today for ongoing prenatal care.  She is currently monitored for the following issues for this low-risk pregnancy and has Rh negative state in antepartum period; Supervision of normal pregnancy; History of gestational hypertension; Elevated blood pressure reading in office with white coat syndrome, without diagnosis of hypertension; and Obesity affecting pregnancy on their problem list.  Patient reports backache.  Contractions: Not present. Vag. Bleeding: None.  Movement: Present. Denies leaking of fluid.   The following portions of the patient's history were reviewed and updated as appropriate: allergies, current medications, past family history, past medical history, past social history, past surgical history and problem list.   Objective:   Vitals:   10/09/23 0926  BP: 125/88  Pulse: (!) 128  Weight: 215 lb (97.5 kg)    Fetal Status: Fetal Heart Rate (bpm): 160   Movement: Present     General:  Alert, oriented and cooperative. Patient is in no acute distress.  Skin: Skin is warm and dry. No rash noted.   Cardiovascular: Normal heart rate noted  Respiratory: Normal respiratory effort, no problems with respiration noted  Abdomen: Soft, gravid, appropriate for gestational age.  Pain/Pressure: Absent     Pelvic: Cervical exam deferred        Extremities: Normal range of motion.  Edema: Trace  Mental Status: Normal mood and affect. Normal behavior. Normal judgment and thought content.   Assessment and Plan:  Pregnancy: G3P1011 at [redacted]w[redacted]d 1. Encounter for supervision of other normal pregnancy in second trimester  -BP good today, reports normal BP readings at home.  - Desires water birth. Has taken the class. - Will scheduled with CNM - Cervicovaginal ancillary only( [place]) - Culture, beta strep (group b only)  Preterm labor symptoms and general obstetric precautions including but not  limited to vaginal bleeding, contractions, leaking of fluid and fetal movement were reviewed in detail with the patient. Please refer to After Visit Summary for other counseling recommendations.   Return She will need a CNM visit to discuss water birth. She has completed the class..  Future Appointments  Date Time Provider Department Center  10/23/2023  9:30 AM Arvel Oquinn, Harolyn Rutherford, NP [facility]  10/30/2023  9:30 AM Dorella Laster, Harolyn Rutherford, NP [facility]  11/06/2023  9:30 AM Keysi Oelkers, Harolyn Rutherford, NP [facility]    Venia Carbon, NP

## 2023-10-10 LAB — CERVICOVAGINAL ANCILLARY ONLY
Chlamydia: NEGATIVE
Comment: NEGATIVE
Comment: NORMAL
Neisseria Gonorrhea: NEGATIVE

## 2023-10-13 LAB — CULTURE, BETA STREP (GROUP B ONLY): Strep Gp B Culture: NEGATIVE

## 2023-10-16 ENCOUNTER — Encounter: Payer: BC Managed Care – PPO | Admitting: Obstetrics and Gynecology

## 2023-10-19 ENCOUNTER — Telehealth: Payer: Self-pay | Admitting: Certified Nurse Midwife

## 2023-10-19 ENCOUNTER — Encounter: Payer: Self-pay | Admitting: Certified Nurse Midwife

## 2023-10-19 NOTE — Telephone Encounter (Signed)
Attempted to call to discuss waterbirth 10/19/23 . Will attempt again, will also send a MyChart message.

## 2023-10-23 ENCOUNTER — Ambulatory Visit (INDEPENDENT_AMBULATORY_CARE_PROVIDER_SITE_OTHER): Payer: BC Managed Care – PPO | Admitting: Obstetrics and Gynecology

## 2023-10-23 VITALS — BP 118/84 | HR 105 | Wt 218.0 lb

## 2023-10-23 DIAGNOSIS — Z3A38 38 weeks gestation of pregnancy: Secondary | ICD-10-CM

## 2023-10-23 DIAGNOSIS — Z3483 Encounter for supervision of other normal pregnancy, third trimester: Secondary | ICD-10-CM

## 2023-10-23 NOTE — Progress Notes (Signed)
   PRENATAL VISIT NOTE  Subjective:  Carrie Hamilton is a 32 y.o. G3P1011 at [redacted]w[redacted]d being seen today for ongoing prenatal care.  She is currently monitored for the following issues for this low-risk pregnancy and has Rh negative state in antepartum period; Supervision of normal pregnancy; History of gestational hypertension; Elevated blood pressure reading in office with white coat syndrome, without diagnosis of hypertension; and Obesity affecting pregnancy on their problem list.  Patient reports no complaints.  Contractions: Not present. Vag. Bleeding: None.  Movement: Present. Denies leaking of fluid.   The following portions of the patient's history were reviewed and updated as appropriate: allergies, current medications, past family history, past medical history, past social history, past surgical history and problem list.   Objective:   Vitals:   10/23/23 0936  BP: 118/84  Pulse: (!) 105  Weight: 218 lb (98.9 kg)    Fetal Status: Fetal Heart Rate (bpm): 138   Movement: Present   Fundal height: 38 cm  General:  Alert, oriented and cooperative. Patient is in no acute distress.  Skin: Skin is warm and dry. No rash noted.   Cardiovascular: Normal heart rate noted  Respiratory: Normal respiratory effort, no problems with respiration noted  Abdomen: Soft, gravid, appropriate for gestational age.     Pelvic: Cervical exam deferred per patient request       Extremities: Normal range of motion.  Edema: None  Mental Status: Normal mood and affect. Normal behavior. Normal judgment and thought content.   Assessment and Plan:  Pregnancy: G3P1011 at [redacted]w[redacted]d There are no diagnoses linked to this encounter. Marland KitchenDahlia Hamilton was seen today for routine prenatal visit.  Diagnoses and all orders for this visit:  Encounter for supervision of other normal pregnancy in third trimester  BP good today. GBS negative  Gonorrhea/Chlamydia negative  No concerns today 9/16 ultrasound normal  Working on getting  CNM visit scheduled for water birth.   Preterm labor symptoms and general obstetric precautions including but not limited to vaginal bleeding, contractions, leaking of fluid and fetal movement were reviewed in detail with the patient. Please refer to After Visit Summary for other counseling recommendations.    Future Appointments  Date Time Provider Department Center  10/30/2023  9:30 AM Tavio Biegel, Harolyn Rutherford, NP [facility]  11/06/2023  9:30 AM Quanita Barona, Harolyn Rutherford, NP [facility]    Venia Carbon, NP

## 2023-10-25 ENCOUNTER — Inpatient Hospital Stay (HOSPITAL_COMMUNITY): Payer: BC Managed Care – PPO | Admitting: Anesthesiology

## 2023-10-25 ENCOUNTER — Encounter (HOSPITAL_COMMUNITY): Payer: Self-pay | Admitting: Obstetrics and Gynecology

## 2023-10-25 ENCOUNTER — Inpatient Hospital Stay (HOSPITAL_COMMUNITY)
Admission: AD | Admit: 2023-10-25 | Discharge: 2023-10-27 | DRG: 806 | Disposition: A | Discharge status: Home / Self Care | Payer: BC Managed Care – PPO | Attending: Obstetrics and Gynecology | Admitting: Obstetrics and Gynecology

## 2023-10-25 ENCOUNTER — Other Ambulatory Visit: Payer: Self-pay

## 2023-10-25 DIAGNOSIS — Z882 Allergy status to sulfonamides status: Secondary | ICD-10-CM

## 2023-10-25 DIAGNOSIS — O99214 Obesity complicating childbirth: Secondary | ICD-10-CM | POA: Diagnosis present

## 2023-10-25 DIAGNOSIS — Z3A38 38 weeks gestation of pregnancy: Secondary | ICD-10-CM | POA: Diagnosis not present

## 2023-10-25 DIAGNOSIS — Z8249 Family history of ischemic heart disease and other diseases of the circulatory system: Secondary | ICD-10-CM | POA: Diagnosis not present

## 2023-10-25 DIAGNOSIS — Z6791 Unspecified blood type, Rh negative: Secondary | ICD-10-CM

## 2023-10-25 DIAGNOSIS — O134 Gestational [pregnancy-induced] hypertension without significant proteinuria, complicating childbirth: Secondary | ICD-10-CM | POA: Diagnosis present

## 2023-10-25 DIAGNOSIS — O9852 Other viral diseases complicating childbirth: Secondary | ICD-10-CM | POA: Diagnosis present

## 2023-10-25 DIAGNOSIS — O139 Gestational [pregnancy-induced] hypertension without significant proteinuria, unspecified trimester: Secondary | ICD-10-CM | POA: Insufficient documentation

## 2023-10-25 DIAGNOSIS — B084 Enteroviral vesicular stomatitis with exanthem: Secondary | ICD-10-CM | POA: Diagnosis present

## 2023-10-25 DIAGNOSIS — O26893 Other specified pregnancy related conditions, third trimester: Secondary | ICD-10-CM | POA: Diagnosis present

## 2023-10-25 LAB — CBC
HCT: 44.6 % (ref 36.0–46.0)
Hemoglobin: 15.2 g/dL — ABNORMAL HIGH (ref 12.0–15.0)
MCH: 30 pg (ref 26.0–34.0)
MCHC: 34.1 g/dL (ref 30.0–36.0)
MCV: 88.1 fL (ref 80.0–100.0)
Platelets: 215 10*3/uL (ref 150–400)
RBC: 5.06 MIL/uL (ref 3.87–5.11)
RDW: 14 % (ref 11.5–15.5)
WBC: 19.3 10*3/uL — ABNORMAL HIGH (ref 4.0–10.5)
nRBC: 0 % (ref 0.0–0.2)

## 2023-10-25 LAB — TYPE AND SCREEN
ABO/RH(D): A NEG
Antibody Screen: NEGATIVE

## 2023-10-25 MED ORDER — EPHEDRINE 5 MG/ML INJ: 10.0000 mg | INTRAVENOUS | Status: DC | PRN

## 2023-10-25 MED ORDER — SOD CITRATE-CITRIC ACID 500-334 MG/5ML PO SOLN: 30.0000 mL | ORAL | Status: DC | PRN

## 2023-10-25 MED ORDER — ONDANSETRON HCL 4 MG/2ML IJ SOLN: 4.0000 mg | Freq: Four times a day (QID) | INTRAMUSCULAR | Status: DC | PRN

## 2023-10-25 MED ORDER — PHENYLEPHRINE 80 MCG/ML (10ML) SYRINGE FOR IV PUSH (FOR BLOOD PRESSURE SUPPORT): 80.0000 ug | PREFILLED_SYRINGE | INTRAVENOUS | Status: DC | PRN

## 2023-10-25 MED ORDER — ACETAMINOPHEN 325 MG PO TABS: 650.0000 mg | ORAL_TABLET | ORAL | Status: DC | PRN

## 2023-10-25 MED ORDER — OXYCODONE-ACETAMINOPHEN 5-325 MG PO TABS: 1.0000 | ORAL_TABLET | ORAL | Status: DC | PRN

## 2023-10-25 MED ORDER — LACTATED RINGERS IV SOLN: INTRAVENOUS | Status: DC

## 2023-10-25 MED ORDER — OXYTOCIN-SODIUM CHLORIDE 30-0.9 UT/500ML-% IV SOLN
2.5000 [IU]/h | INTRAVENOUS | Status: DC
  Administered 2023-10-26: 2.5 [IU]/h via INTRAVENOUS
  Filled 2023-10-25: qty 500

## 2023-10-25 MED ORDER — LIDOCAINE HCL (PF) 1 % IJ SOLN: 30.0000 mL | INTRAMUSCULAR | Status: DC | PRN

## 2023-10-25 MED ORDER — LACTATED RINGERS IV SOLN: 500.0000 mL | INTRAVENOUS | Status: DC | PRN

## 2023-10-25 MED ORDER — LACTATED RINGERS IV SOLN: 500.0000 mL | Freq: Once | INTRAVENOUS | Status: DC

## 2023-10-25 MED ORDER — LIDOCAINE HCL (PF) 1 % IJ SOLN
INTRAMUSCULAR | Status: DC | PRN
  Administered 2023-10-25: 2 mL via EPIDURAL
  Administered 2023-10-25: 3 mL via EPIDURAL
  Administered 2023-10-25: 5 mL via EPIDURAL

## 2023-10-25 MED ORDER — FENTANYL-BUPIVACAINE-NACL 0.5-0.125-0.9 MG/250ML-% EP SOLN
EPIDURAL | Status: AC
  Filled 2023-10-25: qty 250

## 2023-10-25 MED ORDER — DIPHENHYDRAMINE HCL 50 MG/ML IJ SOLN: 12.5000 mg | INTRAMUSCULAR | Status: DC | PRN

## 2023-10-25 MED ORDER — FENTANYL CITRATE (PF) 100 MCG/2ML IJ SOLN
50.0000 ug | INTRAMUSCULAR | Status: DC | PRN
Start: 2023-10-25 — End: 2023-10-26

## 2023-10-25 MED ORDER — OXYCODONE-ACETAMINOPHEN 5-325 MG PO TABS: 2.0000 | ORAL_TABLET | ORAL | Status: DC | PRN

## 2023-10-25 MED ORDER — FENTANYL-BUPIVACAINE-NACL 0.5-0.125-0.9 MG/250ML-% EP SOLN
12.0000 mL/h | EPIDURAL | Status: DC | PRN
  Administered 2023-10-25: 12 mL/h via EPIDURAL

## 2023-10-25 MED ORDER — OXYTOCIN BOLUS FROM INFUSION
333.0000 mL | Freq: Once | INTRAVENOUS | Status: AC
  Administered 2023-10-26: 333 mL via INTRAVENOUS

## 2023-10-25 MED ORDER — PHENYLEPHRINE 80 MCG/ML (10ML) SYRINGE FOR IV PUSH (FOR BLOOD PRESSURE SUPPORT)
80.0000 ug | PREFILLED_SYRINGE | INTRAVENOUS | Status: DC | PRN
  Filled 2023-10-25: qty 10

## 2023-10-25 NOTE — MAU Note (Signed)
.  Georgeanne Frankland is a 32 y.o. at [redacted]w[redacted]d here in MAU reporting: contractions starting around 2 hours ago, reports they are approx every 4 minutes.   Denies VB, LOF and reports +FM  Onset of complaint: 2 hours ago  Pain score: 8/10 There were no vitals filed for this visit.   WNU:[id number] straight back to room  Lab orders placed from triage:  mau labor

## 2023-10-25 NOTE — Progress Notes (Signed)
OBSTETRIC ADMISSION HISTORY AND PHYSICAL  Carrie Hamilton is a 32 y.o. female G3P1011 with IUP at [redacted]w[redacted]d by 1st TMUS presenting for SOL. She reports +FMs, No LOF, no VB, no blurry vision, headaches or peripheral edema, and RUQ pain.  She plans on breast feeding. She declines birth control. She received her prenatal [facility] at  [facility]    Dating: By 1st TMUS --->  Estimated Date of Delivery: 11/03/23  Sono:   @[redacted]w[redacted]d , CWD, normal anatomy, cephalic presentation, anterior placental lie, 1995 gm 4 lb 6 oz 79 % (exp 3760g; proven to 3759g), AC 92%   Prenatal History/Complications:  - Hx of gHTN - Elevated BP reading in office with white coat syndrome without dx of HTN on problem list - Obesity; BMI 35  Past Medical History: Past Medical History:  Diagnosis Date   ADHD    Attention deficit hyperactivity disorder (ADHD) 04/13/2023   Supervision of normal pregnancy 04/09/2023              NURSING     PROVIDER      Office Location    [place]    Dating by    U/S at 10 wks      [facility] Model    Traditional    Anatomy U/S    Normal       Initiated care at     [employer]     English                     LAB RESULTS       Support Person    Programme researcher, broadcasting/film/video    NIPS: low risk BOY          AFP:                            NT/IT (FT only)                 Past Surgical History: Past Surgical History:  Procedure Laterality Date   SHOULDER SURGERY     labral repair of right shoulder    Obstetrical History: OB History     Gravida  3   Para  1   Term  1   Preterm      AB  1   Living  1      SAB  1   IAB      Ectopic      Multiple  0   Live Births  1           Social History Social History   Socioeconomic History   Marital status: Married    Spouse name: Not on file   Number of children: Not on file   Years of education: Not on file   Highest education level: Not on file  Occupational History   Not on file  Tobacco Use   Smoking status: Never    Smokeless tobacco: Never  Vaping Use   Vaping status: Never Used  Substance and Sexual Activity   Alcohol use: Yes    Comment: occasional   Drug use: No   Sexual activity: Yes    Birth control/protection: Condom  Other Topics Concern   Not on file  Social History Narrative   Not on file  Social Determinants of Health   Financial Resource Strain: Not on file  Food Insecurity: Not on file  Transportation Needs: Not on file  Physical Activity: Not on file  Stress: Not on file  Social Connections: Unknown (04/22/2022)   [facility]   Social Network    Social Network: Not on file    Family History: Family History  Problem Relation Age of Onset   Hypertension Mother    Heart attack Father    Sudden death Neg Hx    Hyperlipidemia Neg Hx    Diabetes Neg Hx     Allergies: Allergies  Allergen Reactions   Sulfa Antibiotics Rash    Medications Prior to Admission  Medication Sig Dispense Refill Last Dose   ascorbic acid (VITAMIN C) 500 MG tablet Take 500 mg by mouth daily.   10/25/2023   magnesium (MAGTAB) 84 MG ( ) TBCR SR tablet Take 84 mg by mouth.   10/25/2023   Omega-3 Fatty Acids (FISH OIL) 300 MG CAPS Take by mouth.   10/25/2023   Prenatal Vit-Fe Fumarate-FA (PRENATAL VITAMINS PO) Take by mouth.   10/25/2023   Doxylamine-Pyridoxine 10-10 MG TBEC Take 1 tablet by mouth at bedtime. Take 2 tablets at bedtime. If symptoms persist, add 1 tab in the AM starting on day 3. If symptoms persist, add 1 tab in the PM starting day 4. (Patient not taking: Reported on 07/17/2023) 100 tablet 0      Review of Systems   All systems reviewed and negative except as stated in HPI  Blood pressure 123/83, pulse (!) 124, temperature 97.7 F (36.5 C), temperature source Oral, resp. rate 19, height 5\' 4"  (1.626 m), weight 98.9 kg, last menstrual period 02/02/2023, SpO2 97%, currently breastfeeding. General appearance: alert, cooperative, appears stated age, and  no distress Lungs: clear to auscultation bilaterally Heart: regular rate and rhythm Abdomen: soft, non-tender; bowel sounds normal Pelvic: proved to 3759g, adequate Extremities: Homans sign is negative, no sign of DVT Presentation: cephalic Fetal monitoringBaseline: 135 bpm, Variability: Good {> 6 bpm), Accelerations: Reactive, and Decelerations: Absent Uterine activityFrequency: Every 2-3 minutes Dilation: 4.5 Effacement (%): 70 Station: -2, -1 Exam by:: A RODRIGUEZ RN   Prenatal labs: ABO, Rh: A/Negative/-- (04/26 0908) Antibody: Negative (08/27 0939) Rubella: 1.80 (04/26 0908) RPR: Non Reactive (08/27 0939)  HBsAg: Negative (04/26 0908)  HIV: Non Reactive (08/27 0939)  GBS: Negative/-- (10/22 0931)    Prenatal Transfer Tool  Maternal Diabetes: No Genetic Screening: Normal Maternal Ultrasounds/Referrals: Normal Fetal Ultrasounds or other Referrals:  None Maternal Substance Abuse:  No Significant Maternal Medications:  None Significant Maternal Lab Results:  Group B Strep negative Number of Prenatal Visits:greater than 3 verified prenatal visits Other Comments:  None  No results found for this or any previous visit (from the past 24 hour(s)).  Patient Active Problem List   Diagnosis Date Noted   Obesity affecting pregnancy 06/06/2023   History of gestational hypertension 04/13/2023   Supervision of normal pregnancy 04/09/2023   Rh negative state in antepartum period 11/16/2020   Elevated blood pressure reading in office with white coat syndrome, without diagnosis of hypertension 04/11/2019    Assessment/Plan:  Carrie Hamilton is a 32 y.o. G3P1011 at [redacted]w[redacted]d here for SOL  #Labor: Expectant management; unless cessation of progress then augmentation with AROM and/or Pitocin.  #Pain: Epidural #FWB: Cat I #ID:  GBS neg #MOF: Breast #MOC: Declined #Circ:  Out patient with pediatrician  Wyn Forster, MD  10/25/2023, 9:27 PM

## 2023-10-25 NOTE — Anesthesia Procedure Notes (Signed)
Epidural Patient location during procedure: OB Start time: 10/25/2023 10:17 PM End time: 10/25/2023 10:24 PM  Staffing Anesthesiologist: Marcene Duos, MD Performed: anesthesiologist   Preanesthetic Checklist Completed: patient identified, IV checked, site marked, risks and benefits discussed, surgical consent, monitors and equipment checked, pre-op evaluation and timeout performed  Epidural Patient position: sitting Prep: DuraPrep and site prepped and draped Patient monitoring: continuous pulse ox and blood pressure Approach: midline Location: L4-L5 Injection technique: LOR air  Needle:  Needle type: Tuohy  Needle gauge: 17 G Needle length: 9 cm and 9 Needle insertion depth: 6 cm Catheter type: closed end flexible Catheter size: 19 Gauge Catheter at skin depth: 12 (11-->12 when pt sat upright) cm Test dose: negative  Assessment Events: blood not aspirated, no cerebrospinal fluid, injection not painful, no injection resistance, no paresthesia and negative IV test

## 2023-10-25 NOTE — Anesthesia Preprocedure Evaluation (Signed)
Anesthesia Evaluation  Patient identified by MRN, date of birth, ID band Patient awake    Reviewed: Allergy & Precautions, Patient's Chart, lab work & pertinent test results  Airway Mallampati: II  TM Distance: >3 FB     Dental   Pulmonary neg pulmonary ROS   Pulmonary exam normal        Cardiovascular negative cardio ROS Normal cardiovascular exam     Neuro/Psych negative neurological ROS     GI/Hepatic negative GI ROS, Neg liver ROS,,,  Endo/Other  negative endocrine ROS    Renal/GU negative Renal ROS     Musculoskeletal   Abdominal   Peds  Hematology negative hematology ROS (+)   Anesthesia Other Findings   Reproductive/Obstetrics (+) Pregnancy                             Anesthesia Physical Anesthesia Plan  ASA: 2  Anesthesia Plan: Epidural   Post-op Pain Management:    Induction:   PONV Risk Score and Plan: 2 and Treatment may vary due to age or medical condition  Airway Management Planned: Natural Airway  Additional Equipment:   Intra-op Plan:   Post-operative Plan:   Informed Consent: I have reviewed the patients History and Physical, chart, labs and discussed the procedure including the risks, benefits and alternatives for the proposed anesthesia with the patient or authorized representative who has indicated his/her understanding and acceptance.       Plan Discussed with:   Anesthesia Plan Comments:        Anesthesia Quick Evaluation

## 2023-10-26 ENCOUNTER — Encounter (HOSPITAL_COMMUNITY): Payer: Self-pay | Admitting: Obstetrics and Gynecology

## 2023-10-26 DIAGNOSIS — O134 Gestational [pregnancy-induced] hypertension without significant proteinuria, complicating childbirth: Secondary | ICD-10-CM

## 2023-10-26 DIAGNOSIS — O139 Gestational [pregnancy-induced] hypertension without significant proteinuria, unspecified trimester: Secondary | ICD-10-CM | POA: Insufficient documentation

## 2023-10-26 DIAGNOSIS — Z3A38 38 weeks gestation of pregnancy: Secondary | ICD-10-CM

## 2023-10-26 LAB — COMPREHENSIVE METABOLIC PANEL
ALT: 18 U/L (ref 0–44)
AST: 28 U/L (ref 15–41)
Albumin: 2.9 g/dL — ABNORMAL LOW (ref 3.5–5.0)
Alkaline Phosphatase: 121 U/L (ref 38–126)
Anion gap: 11 (ref 5–15)
BUN: 11 mg/dL (ref 6–20)
CO2: 17 mmol/L — ABNORMAL LOW (ref 22–32)
Calcium: 9.4 mg/dL (ref 8.9–10.3)
Chloride: 107 mmol/L (ref 98–111)
Creatinine, Ser: 0.75 mg/dL (ref 0.44–1.00)
GFR, Estimated: >60 mL/min (ref >60)
Glucose, Bld: 100 mg/dL — ABNORMAL HIGH (ref 70–99)
Potassium: 3.9 mmol/L (ref 3.5–5.1)
Sodium: 135 mmol/L (ref 135–145)
Total Bilirubin: 0.5 mg/dL (ref <1.2)
Total Protein: 6.5 g/dL (ref 6.5–8.1)

## 2023-10-26 LAB — RPR: RPR Ser Ql: NONREACTIVE

## 2023-10-26 MED ORDER — WITCH HAZEL-GLYCERIN EX PADS: 1.0000 | MEDICATED_PAD | CUTANEOUS | Status: DC | PRN

## 2023-10-26 MED ORDER — ACETAMINOPHEN 325 MG PO TABS: 650.0000 mg | ORAL_TABLET | ORAL | Status: DC | PRN

## 2023-10-26 MED ORDER — PRENATAL MULTIVITAMIN CH
1.0000 | ORAL_TABLET | Freq: Every day | ORAL | Status: DC
  Filled 2023-10-26 (×2): qty 1

## 2023-10-26 MED ORDER — SENNOSIDES-DOCUSATE SODIUM 8.6-50 MG PO TABS
2.0000 | ORAL_TABLET | ORAL | Status: DC
  Administered 2023-10-26 – 2023-10-27 (×2): 2 via ORAL
  Filled 2023-10-26 (×2): qty 2

## 2023-10-26 MED ORDER — SIMETHICONE 80 MG PO CHEW: 80.0000 mg | CHEWABLE_TABLET | ORAL | Status: DC | PRN

## 2023-10-26 MED ORDER — NIFEDIPINE ER OSMOTIC RELEASE 30 MG PO TB24
30.0000 mg | ORAL_TABLET | Freq: Every day | ORAL | Status: DC
  Filled 2023-10-26: qty 1

## 2023-10-26 MED ORDER — ONDANSETRON HCL 4 MG PO TABS: 4.0000 mg | ORAL_TABLET | ORAL | Status: DC | PRN

## 2023-10-26 MED ORDER — IBUPROFEN 600 MG PO TABS
600.0000 mg | ORAL_TABLET | Freq: Four times a day (QID) | ORAL | Status: DC
  Administered 2023-10-26 – 2023-10-27 (×6): 600 mg via ORAL
  Filled 2023-10-26 (×6): qty 1

## 2023-10-26 MED ORDER — FUROSEMIDE 20 MG PO TABS
10.0000 mg | ORAL_TABLET | Freq: Every day | ORAL | Status: DC
  Filled 2023-10-26: qty 1

## 2023-10-26 MED ORDER — FERROUS SULFATE 325 (65 FE) MG PO TABS
325.0000 mg | ORAL_TABLET | ORAL | Status: DC
  Filled 2023-10-26: qty 1

## 2023-10-26 MED ORDER — BISACODYL 10 MG RE SUPP: 10.0000 mg | Freq: Every day | RECTAL | Status: DC | PRN

## 2023-10-26 MED ORDER — ONDANSETRON HCL 4 MG/2ML IJ SOLN: 4.0000 mg | INTRAMUSCULAR | Status: DC | PRN

## 2023-10-26 MED ORDER — DIPHENHYDRAMINE HCL 25 MG PO CAPS: 25.0000 mg | ORAL_CAPSULE | Freq: Four times a day (QID) | ORAL | Status: DC | PRN

## 2023-10-26 MED ORDER — COCONUT OIL OIL: 1.0000 | TOPICAL_OIL | Status: DC | PRN

## 2023-10-26 MED ORDER — MEDROXYPROGESTERONE ACETATE 150 MG/ML IM SUSP: 150.0000 mg | INTRAMUSCULAR | Status: DC | PRN

## 2023-10-26 MED ORDER — DIBUCAINE (PERIANAL) 1 % EX OINT: 1.0000 | TOPICAL_OINTMENT | CUTANEOUS | Status: DC | PRN

## 2023-10-26 MED ORDER — MEASLES, MUMPS & RUBELLA VAC IJ SOLR
0.5000 mL | Freq: Once | INTRAMUSCULAR | Status: DC
Start: 2023-10-27 — End: 2023-10-27

## 2023-10-26 MED ORDER — METHYLERGONOVINE MALEATE 0.2 MG/ML IJ SOLN
0.2000 mg | INTRAMUSCULAR | Status: DC | PRN
Start: 2023-10-26 — End: 2023-10-27

## 2023-10-26 MED ORDER — FLEET ENEMA RE ENEM: 1.0000 | ENEMA | Freq: Every day | RECTAL | Status: DC | PRN

## 2023-10-26 MED ORDER — METHYLERGONOVINE MALEATE 0.2 MG PO TABS: 0.2000 mg | ORAL_TABLET | ORAL | Status: DC | PRN

## 2023-10-26 MED ORDER — BENZOCAINE-MENTHOL 20-0.5 % EX AERO
1.0000 | INHALATION_SPRAY | CUTANEOUS | Status: DC | PRN
  Administered 2023-10-26: 1 via TOPICAL
  Filled 2023-10-26: qty 56

## 2023-10-26 MED ORDER — TETANUS-DIPHTH-ACELL PERTUSSIS 5-2.5-18.5 LF-MCG/0.5 IM SUSY
0.5000 mL | PREFILLED_SYRINGE | Freq: Once | INTRAMUSCULAR | Status: DC
Start: 2023-10-27 — End: 2023-10-27

## 2023-10-26 NOTE — Progress Notes (Signed)
Patient refuses taking blood pressures medications. She denies any preE symptoms. She has babyscripts app and is able to record blood pressures in app and visible under episodes of care 6/24 device data. Dorathy Daft, CNM updated and will continue to monitor blood pressures. Royston Cowper, RN

## 2023-10-26 NOTE — Anesthesia Postprocedure Evaluation (Signed)
Anesthesia Post Note  Patient: Seth Borruso  Procedure(s) Performed: AN AD HOC LABOR EPIDURAL     Patient location during evaluation: Mother Baby Anesthesia Type: Epidural Level of consciousness: awake and alert and oriented Pain management: satisfactory to patient Vital Signs Assessment: post-procedure vital signs reviewed and stable Respiratory status: respiratory function stable Cardiovascular status: stable Postop Assessment: no headache, no backache, epidural receding, patient able to bend at knees, no signs of nausea or vomiting, adequate PO intake and able to ambulate Anesthetic complications: no   No notable events documented.  Last Vitals:  Vitals:   10/26/23 0305 10/26/23 0406  BP: (!) 136/90 (!) 137/90  Pulse: (!) 105 (!) 101  Resp: 18 18  Temp: 36.6 C 36.8 C  SpO2: 98% 100%    Last Pain:  Vitals:   10/26/23 0500  TempSrc:   PainSc: 3    Pain Goal:                   Amesha Bailey

## 2023-10-26 NOTE — Lactation Note (Signed)
This note was copied from a baby's chart. Lactation Consultation Note  Patient Name: Carrie Hamilton [id number] Date: 10/26/2023 Age:32 hours , P2 experienced  Reason for consult: Initial assessment;Early term 37-38.6wks LC updated the doc flow sheets per mom. Last feeding was at 0715.  LC recommended to call with feeding cues for latch assessment.  LC reviewed BF goals for 24 hours - feed with cues and by 3 hours offer the breast.   Maternal Data    Feeding Mother's Current Feeding Choice: Breast Milk  LATCH Score-none as of yet    Interventions  Education   Discharge Pump: Personal;DEBP (per mom DEBP Spectra)  Consult Status Consult Status: Follow-up Date: 10/26/23 Follow-up type: In-patient    Matilde Sprang Lott Seelbach 10/26/2023, 8:32 AM

## 2023-10-26 NOTE — Progress Notes (Signed)
Labor Progress Note Carrie Hamilton is a 32 y.o. G3P1011 at [redacted]w[redacted]d presented for SOL S: Comfortable with epidural. SROM clear.   O:  BP 113/71   Pulse 90   Temp 98 F (36.7 C) (Oral)   Resp 16   Ht 5\' 4"  (1.626 m)   Wt 98.9 kg   LMP 02/02/2023   SpO2 100%   BMI 37.44 kg/m  EFM: 135/moderate variability/accels present/single prolonged to 90s of 2 mins with left lateral positioning recovered without intervention  CVE: Dilation: 5.5 Effacement (%): 80 Station: -1, 0 Presentation: Vertex Exam by:: J TUrner RN   A&P: 32 y.o. G3P1011 [redacted]w[redacted]d admitted for SOL #Labor: Progressing well. Expectant management. Pitocin if contractions space >5 minutes apart. #Pain: Epidural #FWB: Cat II -> I #GBS negative  #gHTN: mild range BP; Preeclampsia labs added  Wyn Forster, MD 12:25 AM

## 2023-10-26 NOTE — Discharge Summary (Signed)
Postpartum Discharge Summary  Date of Service updated***     Patient Name: Carrie Hamilton DOB: 1991-06-10 MRN: [id number]  Date of admission: 10/25/2023 Delivery date:10/26/2023 Delivering provider: Wyn Forster Date of discharge: 10/26/2023  Admitting diagnosis: Normal labor [O80, Z37.9] Intrauterine pregnancy: [redacted]w[redacted]d     Secondary diagnosis:  Principal Problem:   Normal labor Active Problems:   Gestational hypertension   Vaginal delivery  Additional problems: ***    Discharge diagnosis: Term Pregnancy Delivered and Gestational Hypertension                                              Post partum procedures:{Postpartum procedures:23558} Augmentation: none Complications: None  Hospital course: Onset of Labor With Vaginal Delivery      32 y.o. yo G3P1011 at [redacted]w[redacted]d was admitted in Active Labor on 10/25/2023. Labor course was complicated by nothing  Membrane Rupture Time/Date: 11:02 PM,10/25/2023  Delivery Method:Vaginal, Spontaneous Operative Delivery:N/A Episiotomy: None Lacerations:  1st degree Patient had a postpartum course complicated by ***.  She is ambulating, tolerating a regular diet, passing flatus, and urinating well. Patient is discharged home in stable condition on 10/26/23.  Newborn Data: Birth date:10/26/2023 Birth time:1:06 AM Gender:Female Living status:Living Apgars:9 ,9  Weight:   Magnesium Sulfate received: {Mag received:30440022} BMZ received: No Rhophylac:N/A MMR:N/A T-DaP:{Tdap:23962} Flu: {WUJ:81191} RSV Vaccine received: No Transfusion:{Transfusion received:30440034}  Immunizations received: Immunization History  Administered Date(s) Administered   HPV Quadrivalent 10/26/2006, 12/21/2009, 05/26/2011   Tdap 09/18/2006, 12/26/2016    Physical exam  Vitals:   10/26/23 0001 10/26/23 0030 10/26/23 0100 10/26/23 0121  BP: 113/71 111/67 134/87 135/80  Pulse: 90 92 (!) 102 99  Resp: 16 15    Temp: 98.2 F (36.8 C)     TempSrc: Oral      SpO2:      Weight:      Height:       General: {Exam; general:21111117} Lochia: {Desc; appropriate/inappropriate:30686::"appropriate"} Uterine Fundus: {Desc; firm/soft:30687} Incision: {Exam; incision:21111123} DVT Evaluation: {Exam; dvt:2111122} Labs: Lab Results  Component Value Date   WBC 19.3 (H) 10/25/2023   HGB 15.2 (H) 10/25/2023   HCT 44.6 10/25/2023   MCV 88.1 10/25/2023   PLT 215 10/25/2023      Latest Ref Rng & Units 10/25/2023    9:47 PM  CMP  Glucose 70 - 99 mg/dL 478   BUN 6 - 20 mg/dL 11   Creatinine 2.95 - 1.00 mg/dL 6.21   Sodium 308 - 657 mmol/L 135   Potassium 3.5 - 5.1 mmol/L 3.9   Chloride 98 - 111 mmol/L 107   CO2 22 - 32 mmol/L 17   Calcium 8.9 - 10.3 mg/dL 9.4   Total Protein 6.5 - 8.1 g/dL 6.5   Total Bilirubin <8.4 mg/dL 0.5   Alkaline Phos 38 - 126 U/L 121   AST 15 - 41 U/L 28   ALT 0 - 44 U/L 18    Edinburgh Score:    06/10/2021    8:41 AM  Edinburgh Postnatal Depression Scale Screening Tool  I have been able to laugh and see the funny side of things. 0  I have looked forward with enjoyment to things. 0  I have blamed myself unnecessarily when things went wrong. 0  I have been anxious or worried for no good reason. 0  I have felt scared or panicky for no  good reason. 0  Things have been getting on top of me. 1  I have been so unhappy that I have had difficulty sleeping. 0  I have felt sad or miserable. 1  I have been so unhappy that I have been crying. 1  The thought of harming myself has occurred to me. 0  Edinburgh Postnatal Depression Scale Total 3   No data recorded  After visit meds:  Allergies as of 10/26/2023       Reactions   Sulfa Antibiotics Rash     Med Rec must be completed prior to using this [facility]***        Discharge home in stable condition Infant Feeding: {Baby feeding:23562} Infant Disposition:{CHL IP OB HOME WITH XBMWUX:32440} Discharge instruction: per After Visit Summary and Postpartum  booklet. Activity: Advance as tolerated. Pelvic rest for 6 weeks.  Diet: {OB NUUV:25366440} Future Appointments: Future Appointments  Date Time Provider Department Center  10/30/2023  9:30 AM Rasch, Harolyn Rutherford, NP [facility]  11/06/2023  9:30 AM Rasch, Harolyn Rutherford, NP [facility]   Follow up Visit:   Please schedule this patient for a In person postpartum visit in 4 weeks with the following provider: Any provider. Additional Postpartum F/U:BP check 1 week  Low risk pregnancy complicated by: HTN Delivery mode:  Vaginal, Spontaneous Anticipated Birth Control:   NFP   10/26/2023 Jacklyn Shell, CNM

## 2023-10-27 ENCOUNTER — Other Ambulatory Visit (HOSPITAL_COMMUNITY): Payer: Self-pay

## 2023-10-27 DIAGNOSIS — B084 Enteroviral vesicular stomatitis with exanthem: Secondary | ICD-10-CM | POA: Insufficient documentation

## 2023-10-27 MED ORDER — IBUPROFEN 600 MG PO TABS
600.0000 mg | ORAL_TABLET | Freq: Four times a day (QID) | ORAL | 0 refills | Status: DC
  Filled 2023-10-27: qty 90, 23d supply, fill #0

## 2023-10-27 MED ORDER — WITCH HAZEL-GLYCERIN EX PADS
1.0000 | MEDICATED_PAD | CUTANEOUS | 12 refills | Status: DC | PRN
  Filled 2023-10-27: qty 40, 14d supply, fill #0

## 2023-10-27 MED ORDER — COCONUT OIL OIL: 1.0000 | TOPICAL_OIL | Status: DC | PRN

## 2023-10-27 MED ORDER — NIFEDIPINE ER 30 MG PO TB24
30.0000 mg | ORAL_TABLET | Freq: Every day | ORAL | 0 refills | Status: DC
  Filled 2023-10-27: qty 30, 30d supply, fill #0

## 2023-10-27 MED ORDER — FERROUS SULFATE 325 (65 FE) MG PO TABS
325.0000 mg | ORAL_TABLET | ORAL | 3 refills | Status: DC
  Filled 2023-10-27: qty 30, 60d supply, fill #0

## 2023-10-27 MED ORDER — DIBUCAINE (PERIANAL) 1 % EX OINT: 1.0000 | TOPICAL_OINTMENT | CUTANEOUS | Status: DC | PRN

## 2023-10-27 MED ORDER — BENZOCAINE-MENTHOL 20-0.5 % EX AERO: 1.0000 | INHALATION_SPRAY | CUTANEOUS | Status: DC | PRN

## 2023-10-27 MED ORDER — ACETAMINOPHEN 325 MG PO TABS: 650.0000 mg | ORAL_TABLET | ORAL | Status: DC | PRN

## 2023-10-27 MED ORDER — FUROSEMIDE 20 MG PO TABS
20.0000 mg | ORAL_TABLET | Freq: Every day | ORAL | 0 refills | Status: DC
  Filled 2023-10-27: qty 5, 5d supply, fill #0

## 2023-10-27 NOTE — Discharge Instructions (Signed)
WHAT TO LOOK OUT FOR: Fever of 100.4 or above Mastitis: feels like flu and breasts hurt Infection: increased pain, swelling or redness Blood clots golf ball size or larger Postpartum depression   Congratulations on your newest addition!

## 2023-10-27 NOTE — Progress Notes (Signed)
POSTPARTUM PROGRESS NOTE  Subjective: Carrie Hamilton is a 32 y.o. [id number] s/p SVD at [redacted]w[redacted]d.  She reports she is doing well. No acute events overnight. She denies any problems with ambulating, voiding or po intake. Denies nausea or vomiting. She has passed flatus. She has not had bowel movement. Pain is well controlled.  Lochia is Minimal.  No headache, vision changes, and RUQ pain.  Objective: BP 108/73 (BP Location: Right Arm)   Pulse 91   Temp 97.9 F (36.6 C) (Oral)   Resp 16   Ht 5\' 4"  (1.626 m)   Wt 98.9 kg   LMP 02/02/2023   SpO2 98%   Breastfeeding Unknown   BMI 37.44 kg/m   Physical Exam:  General: alert, cooperative and no distress Chest: CTAB, no respiratory distress Abdomen: soft, non-tender  Uterine Fundus: firm, appropriately tender Extremities: No calf swelling, tenderness, or edema  Recent Labs    10/25/23 2147  HGB 15.2*  HCT 44.6    Assessment/Plan: Carrie Hamilton is a 32 y.o. M8U1324 s/p SVD at [redacted]w[redacted]d for SOL.   LOS: 2 days   PPD#1: Doing well, pain well-controlled.  -- Routine postpartum care, lactation support -- Encouraged up OOB -- Contraception: no method, declined -- Feeding: breast feeding -- Circumcision: yes, outpatient  #PP HTN: Prescribed nifedipine XR 30 mg, Lasix 10 mg yesterday for mild range BPs, but declines medicine due to desire to avoid medications.  BPs now normal range.  Promises to check BP at home with cuff.  CTM. #Rh negative: Baby is Rh neg as well, no Rhogam indicated.  Dispo: Plan for discharge later today if possible.  Carrie Shuford Sharion Dove, MD [facility] PGY-1 [facility] 10/27/23 6:34 AM

## 2023-10-27 NOTE — Lactation Note (Signed)
This note was copied from a baby's chart. Lactation Consultation Note  Patient Name: Carrie Hamilton [id number] Date: 10/27/2023 Age:32 hours Reason for consult: Follow-up assessment  P2, 38 wks. LC rounds. Discussed of feeding/diapers expectations as milk comes in. Mom shares baby latching well. Last baby had a lip tie, was released with great results. Discussed issues to watch for with lip or tongue ties. Mom has a trusted professional to follow-up with for Dx/Treatment if needed. Discussed latching, keeping infant working at breast, and breast care. Infant awakens and mom receptive to putting to breast. Demonstrated starting with hand expression, couple position tweaks by [facility], mom latches baby well. Highlighted pieces of good latch to watch for. Re-enforced O/P LC services available to mom as needed.   Maternal Data Has patient been taught Hand Expression?: Yes Does the patient have breastfeeding experience prior to this delivery?: Yes  Feeding Mother's Current Feeding Choice: Breast Milk  LATCH Score Latch: Grasps breast easily, tongue down, lips flanged, rhythmical sucking.  Audible Swallowing: Spontaneous and intermittent  Type of Nipple: Everted at rest and after stimulation  Comfort (Breast/Nipple): Soft / non-tender  Hold (Positioning): Assistance needed to correctly position infant at breast and maintain latch.  LATCH Score: 9   Lactation Tools Discussed/Used    Interventions Interventions: Breast feeding basics reviewed;Assisted with latch;Hand express;Support pillows;Education  Discharge Discharge Education: Engorgement and breast care  Consult Status Consult Status: Complete Date: 10/27/23    Idamae Lusher 10/27/2023, 10:41 AM

## 2023-10-30 ENCOUNTER — Encounter: Payer: BC Managed Care – PPO | Admitting: Obstetrics and Gynecology

## 2023-11-06 ENCOUNTER — Encounter: Payer: BC Managed Care – PPO | Admitting: Obstetrics and Gynecology

## 2023-11-09 ENCOUNTER — Telehealth (HOSPITAL_COMMUNITY): Payer: Self-pay | Admitting: *Deleted

## 2023-11-09 NOTE — Telephone Encounter (Signed)
11/09/2023  Name: Carrie Hamilton MRN: [id number] DOB: 03-Mar-1991  Reason for Call:  Transition of Care Hospital Discharge Call  Contact Status: Patient Contact Status: Message  Language assistant needed:          Follow-Up Questions:    Inocente Salles Postnatal Depression Scale:  In the Past 7 Days:    PHQ2-9 Depression Scale:     Discharge Follow-up:    Post-discharge interventions: NA  Salena Saner, RN 11/09/2023 14:05

## 2023-11-23 ENCOUNTER — Encounter: Payer: Self-pay | Admitting: Obstetrics and Gynecology

## 2023-11-23 ENCOUNTER — Ambulatory Visit (INDEPENDENT_AMBULATORY_CARE_PROVIDER_SITE_OTHER): Payer: BC Managed Care – PPO | Admitting: Obstetrics and Gynecology

## 2023-11-23 NOTE — Progress Notes (Signed)
Post Partum Visit Note  Carrie Hamilton is a 32 y.o. G73P2012 female who presents for a postpartum visit. She is 4 weeks postpartum following a normal spontaneous vaginal delivery.  I have fully reviewed the prenatal and intrapartum course. The delivery was at 38.6 gestational weeks.  Anesthesia: epidural. Postpartum course has been unremarkable. Baby is doing well. Baby is feeding by breast. Bleeding staining only. Bowel function is normal. Bladder function is normal. Patient is not sexually active. Contraception method is none. Postpartum depression screening: negative.   The pregnancy intention screening data noted above was reviewed. Potential methods of contraception were discussed. The patient elected to proceed with No data recorded.   Edinburgh Postnatal Depression Scale - 11/23/23 1007       Edinburgh Postnatal Depression Scale:  In the Past 7 Days   I have been able to laugh and see the funny side of things. 0    I have looked forward with enjoyment to things. 0    I have blamed myself unnecessarily when things went wrong. 0    I have been anxious or worried for no good reason. 0    I have felt scared or panicky for no good reason. 0    Things have been getting on top of me. 0    I have been so unhappy that I have had difficulty sleeping. 0    I have felt sad or miserable. 0    I have been so unhappy that I have been crying. 0    The thought of harming myself has occurred to me. 0    Edinburgh Postnatal Depression Scale Total 0             Health Maintenance Due  Topic Date Due   INFLUENZA VACCINE  Never done   COVID-19 Vaccine (1 - 2023-24 season) Never done    The following portions of the patient's history were reviewed and updated as appropriate: allergies, current medications, past family history, past medical history, past social history, past surgical history, and problem list.  Review of Systems Pertinent items are noted in HPI.  Objective:  BP 113/81    Pulse 88   Ht 5\' 4"  (1.626 m)   Wt 199 lb (90.3 kg)   LMP 02/02/2023   Breastfeeding Yes   BMI 34.16 kg/m    General:  alert and cooperative   Breasts:  not indicated  Lungs: clear to auscultation bilaterally  Heart:  regular rate and rhythm, S1, S2 normal, no murmur, click, rub or gallop  Abdomen: soft, non-tender; bowel sounds normal; no masses,  no organomegaly        Assessment:     Normal postpartum exam.  Patient declines BC  Plan:   Essential components of care per ACOG recommendations:  1.  Mood and well being: Patient with negative depression screening today. Reviewed local resources for support.  - Patient tobacco use? No.   - hx of drug use? No.    2. Infant care and feeding:  -Patient currently breastmilk feeding? Yes. Reviewed importance of draining breast regularly to support lactation.  -Social determinants of health (SDOH) reviewed in EPIC. No concerns  3. Sexuality, contraception and birth spacing - Patient does not want a pregnancy in the next year.  Desired family size is Unsure children.  - Reviewed reproductive life planning. Reviewed contraceptive methods based on pt preferences and effectiveness.  Patient desired No Method - Other Reason today.   - Discussed birth spacing of  18 months  4. Sleep and fatigue -Encouraged family/partner/community support of 4 hrs of uninterrupted sleep to help with mood and fatigue  5. Physical Recovery  - Discussed patients delivery and complications. She describes her labor as good. - Patient had a Vaginal, no problems at delivery. Patient had no laceration. Perineal healing reviewed. Patient expressed understanding - Patient has urinary incontinence? Yes. Discussed role of pelvic floor PT. - Patient is safe to resume physical and sexual activity  6.  Health Maintenance - HM due items addressed Yes - Last pap smear  Diagnosis  Date Value Ref Range Status  07/13/2022   Final   - Negative for intraepithelial  lesion or malignancy (NILM)   Pap smear not done at today's visit.  -Breast Cancer screening indicated? No.   7. Chronic Disease/Pregnancy Condition follow up: None  - PCP follow up  Venia Carbon, NP [facility] [employer], [facility]

## 2023-11-26 ENCOUNTER — Encounter (INDEPENDENT_AMBULATORY_CARE_PROVIDER_SITE_OTHER): Payer: Self-pay | Admitting: *Deleted
